# Patient Record
Sex: Male | Born: 1935
Health system: Southern US, Community
[De-identification: ages and names within clinical notes are randomized; demographics above are authoritative.]

## PROBLEM LIST (undated history)

## (undated) DIAGNOSIS — G479 Sleep disorder, unspecified: Secondary | ICD-10-CM

## (undated) DIAGNOSIS — E785 Hyperlipidemia, unspecified: Secondary | ICD-10-CM

## (undated) DIAGNOSIS — K5909 Other constipation: Secondary | ICD-10-CM

## (undated) DIAGNOSIS — I10 Essential (primary) hypertension: Secondary | ICD-10-CM

## (undated) DIAGNOSIS — N200 Calculus of kidney: Secondary | ICD-10-CM

## (undated) HISTORY — DX: Other constipation: K59.09

## (undated) HISTORY — PX: KIDNEY STONE SURGERY: SHX686

## (undated) HISTORY — DX: Calculus of kidney: N20.0

## (undated) HISTORY — DX: Hyperlipidemia, unspecified: E78.5

## (undated) HISTORY — DX: Sleep disorder, unspecified: G47.9

## (undated) HISTORY — PX: HEMORRHOID SURGERY: SHX153

## (undated) HISTORY — PX: CATARACT EXTRACTION W/ INTRAOCULAR LENS IMPLANT: SHX1309

---

## 2010-06-02 DIAGNOSIS — D239 Other benign neoplasm of skin, unspecified: Secondary | ICD-10-CM

## 2010-06-02 HISTORY — DX: Other benign neoplasm of skin, unspecified: D23.9

## 2013-06-20 ENCOUNTER — Institutional Professional Consult (permissible substitution): Payer: Self-pay | Admitting: Pulmonary Disease

## 2014-04-28 DIAGNOSIS — G4700 Insomnia, unspecified: Secondary | ICD-10-CM | POA: Insufficient documentation

## 2014-06-22 DIAGNOSIS — I10 Essential (primary) hypertension: Secondary | ICD-10-CM | POA: Insufficient documentation

## 2016-09-29 ENCOUNTER — Emergency Department
Admission: EM | Admit: 2016-09-29 | Discharge: 2016-09-29 | Disposition: A | Discharge status: Home / Self Care | Payer: Medicare Other | Attending: Emergency Medicine | Admitting: Emergency Medicine

## 2016-09-29 ENCOUNTER — Emergency Department: Payer: Medicare Other

## 2016-09-29 ENCOUNTER — Encounter: Payer: Self-pay | Admitting: Emergency Medicine

## 2016-09-29 DIAGNOSIS — K5900 Constipation, unspecified: Secondary | ICD-10-CM | POA: Diagnosis present

## 2016-09-29 DIAGNOSIS — I10 Essential (primary) hypertension: Secondary | ICD-10-CM | POA: Diagnosis not present

## 2016-09-29 DIAGNOSIS — K5901 Slow transit constipation: Secondary | ICD-10-CM

## 2016-09-29 HISTORY — DX: Essential (primary) hypertension: I10

## 2016-09-29 MED ORDER — MINERAL OIL RE ENEM
1.0000 | ENEMA | Freq: Once | RECTAL | Status: AC
  Administered 2016-09-29: 1 via RECTAL

## 2016-09-29 MED ORDER — SENNOSIDES-DOCUSATE SODIUM 8.6-50 MG PO TABS: 2.0000 | ORAL_TABLET | Freq: Every day | ORAL | 1 refills | Status: DC | PRN

## 2016-09-29 MED ORDER — GLYCERIN (ADULT) 2 G RE SUPP: 1.0000 | RECTAL | 0 refills | Status: DC | PRN

## 2016-09-29 MED ORDER — GLYCERIN (LAXATIVE) 2.1 G RE SUPP
RECTAL | Status: AC
  Filled 2016-09-29: qty 1

## 2016-09-29 MED ORDER — SENNOSIDES-DOCUSATE SODIUM 8.6-50 MG PO TABS: 2.0000 | ORAL_TABLET | Freq: Every day | ORAL | 1 refills | Status: AC

## 2016-09-29 NOTE — ED Notes (Signed)
Pt discharged home after verbalizing understanding of discharge instructions; nad noted. 

## 2016-09-29 NOTE — ED Triage Notes (Signed)
Patient arrives to ED via POV with c/o constipation x 3 days. Patient states he is normally able to have a BM every day. Patient A&O x4, ambulatory to room.

## 2016-09-29 NOTE — ED Provider Notes (Addendum)
[facility] Emergency Department Provider Note   ____________________________________________   First MD Initiated Contact with Patient 09/29/16 1426     (approximate)  I have reviewed the triage vital signs and the nursing notes.   HISTORY  Chief Complaint Constipation    HPI Tyler Tapia is a 82 y.o. male patient complain of constipation for 3 days. Patient stated normally has bowel movement every day. Patient denies any abdominal pain or bloating. Patient state tried laxative without relief. No other palliative measures for this complaint.  Past Medical History:  Diagnosis Date  . Hypertension     There are no active problems to display for this patient.   History reviewed. No pertinent surgical history.  Prior to Admission medications   Medication Sig Start Date End Date Taking? Authorizing Provider  glycerin adult 2 g suppository Place 1 suppository rectally as needed for constipation. 09/29/16   Sable Feil, PA-C  senna-docusate (SENOKOT-S) 8.6-50 MG tablet Take 2 tablets by mouth at bedtime. 09/29/16 09/29/17  Sable Feil, PA-C    Allergies Patient has no known allergies.  No family history on file.  Social History Social History  Substance Use Topics  . Smoking status: Never Smoker  . Smokeless tobacco: Never Used  . Alcohol use Yes     Comment: 1 glass of beer/wine a day    Review of Systems Constitutional: No fever/chills Eyes: No visual changes. ENT: No sore throat. Cardiovascular: Denies chest pain. Respiratory: Denies shortness of breath. Gastrointestinal: No abdominal pain.  No nausea, no vomiting.  No diarrhea.  3 days of constipation Genitourinary: Negative for dysuria. Musculoskeletal: Negative for back pain. Skin: Negative for rash. Neurological: Negative for headaches, focal weakness or numbness. Endocrine:Hypertension   ____________________________________________   PHYSICAL EXAM:  VITAL  SIGNS: ED Triage Vitals [09/29/16 1411]  Enc Vitals Group     BP (!) 144/94     Pulse Rate 82     Resp 17     Temp 97.7 F (36.5 C)     Temp Source Oral     SpO2 95 %     Weight 152 lb (68.9 kg)     Height 5\' 9"  (1.753 m)     Head Circumference      Peak Flow      Pain Score      Pain Loc      Pain Edu?      Excl. in [place]?     Constitutional: Alert and oriented. Well appearing and in no acute distress. Eyes: Conjunctivae are normal. PERRL. EOMI. Head: Atraumatic. Nose: No congestion/rhinnorhea. Mouth/Throat: Mucous membranes are moist.  Oropharynx non-erythematous. Neck: No stridor.  No cervical spine tenderness to palpation. Hematological/Lymphatic/Immunilogical: No cervical lymphadenopathy. Cardiovascular: Normal rate, regular rhythm. Grossly normal heart sounds.  Good peripheral circulation. Respiratory: Normal respiratory effort.  No retractions. Lungs CTAB. Gastrointestinal: Soft and nontender. No distention. No abdominal bruits. No CVA tenderness. Musculoskeletal: No lower extremity tenderness nor edema.  No joint effusions. Neurologic:  Normal speech and language. No gross focal neurologic deficits are appreciated. No gait instability. Skin:  Skin is warm, dry and intact. No rash noted. Psychiatric: Mood and affect are normal. Speech and behavior are normal.  ____________________________________________   LABS (all labs ordered are listed, but only abnormal results are displayed)  Labs Reviewed - No data to display ____________________________________________  EKG   ____________________________________________  RADIOLOGY  KUB reveals no impaction or obstruction. Moderate stool  ____________________________________________   PROCEDURES  Procedure(s)  performed: None  Procedures  Critical Care performed: No  ____________________________________________   INITIAL IMPRESSION / ASSESSMENT AND PLAN / ED COURSE  Pertinent labs & imaging results that  were available during my care of the patient were reviewed by me and considered in my medical decision making (see chart for details).  Constipation. Patient given discharge care instructions. Patient given a prescription for Peri-Colace. Patient advised return back in 24 hours if no bowel movement.  Clinical Course   Discussed with patient KUB findings. Patient requesting a Fleet enema. No return status post 15 minutes retention of Fleet enema. Patient placed on a mild laxative and stool softener.   ____________________________________________   FINAL CLINICAL IMPRESSION(S) / ED DIAGNOSES  Final diagnoses:  Constipation by delayed colonic transit      NEW MEDICATIONS STARTED DURING THIS VISIT:  New Prescriptions   GLYCERIN ADULT 2 G SUPPOSITORY    Place 1 suppository rectally as needed for constipation.   SENNA-DOCUSATE (SENOKOT-S) 8.6-50 MG TABLET    Take 2 tablets by mouth at bedtime.     Note:  This document was prepared using Dragon voice recognition software and may include unintentional dictation errors.    Sable Feil, PA-C 09/29/16 1605    Lisa Roca, MD 09/29/16 [place], PA-C 09/29/16 [place], MD 09/30/16 [phone]

## 2016-09-30 ENCOUNTER — Emergency Department
Admission: EM | Admit: 2016-09-30 | Discharge: 2016-09-30 | Disposition: A | Discharge status: Home / Self Care | Payer: Medicare Other | Attending: Emergency Medicine | Admitting: Emergency Medicine

## 2016-09-30 ENCOUNTER — Encounter: Payer: Self-pay | Admitting: Emergency Medicine

## 2016-09-30 DIAGNOSIS — Z79899 Other long term (current) drug therapy: Secondary | ICD-10-CM | POA: Diagnosis not present

## 2016-09-30 DIAGNOSIS — K5900 Constipation, unspecified: Secondary | ICD-10-CM | POA: Diagnosis present

## 2016-09-30 DIAGNOSIS — I10 Essential (primary) hypertension: Secondary | ICD-10-CM | POA: Insufficient documentation

## 2016-09-30 MED ORDER — MAGNESIUM CITRATE PO SOLN
1.0000 | Freq: Once | ORAL | Status: AC
  Administered 2016-09-30: 1 via ORAL
  Filled 2016-09-30: qty 296

## 2016-09-30 MED ORDER — POLYETHYLENE GLYCOL 3350 17 GM/SCOOP PO POWD: 17.0000 g | Freq: Two times a day (BID) | ORAL | 0 refills | Status: AC

## 2016-09-30 NOTE — ED Notes (Signed)
Per pt no BM yet, has been up to the bathroom to urinate x 1

## 2016-09-30 NOTE — ED Provider Notes (Signed)
[facility] Emergency Department Provider Note  ____________________________________________  Time seen: Approximately 11:29 AM  I have reviewed the triage vital signs and the nursing notes.   HISTORY  Chief Complaint Constipation    HPI Tyler Tapia is a 82 y.o. male who complains of constipation for 3 or 4 days. He was seen in the ED yesterday with complaints of constipation without any other complaints and no pain complaints. He received an x-ray which is consistent with constipation and an enema and rectal exam whichwere fruitless at the time. No evidence of fecal impaction on the x-ray.  Patient went home and took Senokot as instructed. He did have a small bowel movement this morning but is dissatisfied that he wasn't able to empty more. He comes back to the ED for repeat evaluation wondering if he has a fecal impaction.     Past Medical History:  Diagnosis Date  . Hypertension      There are no active problems to display for this patient.    History reviewed. No pertinent surgical history.   Prior to Admission medications   Medication Sig Start Date End Date Taking? Authorizing Provider  glycerin adult 2 g suppository Place 1 suppository rectally as needed for constipation. 09/29/16   Sable Feil, PA-C  polyethylene glycol powder (GLYCOLAX/MIRALAX) powder Take 17 g by mouth 2 (two) times daily. 09/30/16 10/03/16  Carrie Mew, MD  senna-docusate (SENOKOT-S) 8.6-50 MG tablet Take 2 tablets by mouth at bedtime. 09/29/16 09/29/17  Sable Feil, PA-C  senna-docusate (SENOKOT-S) 8.6-50 MG tablet Take 2 tablets by mouth daily as needed for mild constipation. 09/29/16 09/29/17  Sable Feil, PA-C     Allergies Patient has no known allergies.   No family history on file.  Social History Social History  Substance Use Topics  . Smoking status: Never Smoker  . Smokeless tobacco: Never Used  . Alcohol use Yes     Comment: 1 glass  of beer/wine a day    Review of Systems  Constitutional:   No fever or chills.  ENT:   No sore throat. No rhinorrhea. Cardiovascular:   No chest pain. Respiratory:   No dyspnea or cough. Gastrointestinal:   Negative for abdominal pain, vomiting and diarrhea.  Genitourinary:   Negative for dysuria or difficulty urinating. Musculoskeletal:   Negative for focal pain or swelling Neurological:   Negative for headaches 10-point ROS otherwise negative.  ____________________________________________   PHYSICAL EXAM:  VITAL SIGNS: ED Triage Vitals  Enc Vitals Group     BP 09/30/16 0921 (!) 148/89     Pulse Rate 09/30/16 0921 (!) 3     Resp 09/30/16 0921 18     Temp 09/30/16 0921 97.5 F (36.4 C)     Temp Source 09/30/16 0921 Oral     SpO2 09/30/16 0921 95 %     Weight 09/30/16 0922 152 lb (68.9 kg)     Height 09/30/16 0922 5\' 9"  (1.753 m)     Head Circumference --      Peak Flow --      Pain Score --      Pain Loc --      Pain Edu? --      Excl. in [place]? --     Vital signs reviewed, nursing assessments reviewed.   Constitutional:   Alert and oriented. Well appearing and in no distress. Eyes:   No scleral icterus. No conjunctival pallor. PERRL. EOMI.  No nystagmus. ENT   Head:  Normocephalic and atraumatic.   Nose:   No congestion/rhinnorhea. No septal hematoma   Mouth/Throat:   MMM, no pharyngeal erythema. No peritonsillar mass.    Neck:   No stridor. No SubQ emphysema. No meningismus. Hematological/Lymphatic/Immunilogical:   No cervical lymphadenopathy. Cardiovascular:   RRR. Symmetric bilateral radial and DP pulses.  No murmurs.  Respiratory:   Normal respiratory effort without tachypnea nor retractions. Breath sounds are clear and equal bilaterally. No wheezes/rales/rhonchi. Gastrointestinal:   Soft and nontender. Non distended. There is no CVA tenderness.  No rebound, rigidity, or guarding.Rectal exam shows external hemorrhoid that is not thrombosed inflamed  or bleeding. Rectal vault is empty. Genitourinary:   deferred Musculoskeletal:   Nontender with normal range of motion in all extremities. No joint effusions.  No lower extremity tenderness.  No edema. Neurologic:   Normal speech and language.  CN 2-10 normal. Motor grossly intact. No gross focal neurologic deficits are appreciated.  Skin:    Skin is warm, dry and intact. No rash noted.  No petechiae, purpura, or bullae.  ____________________________________________    LABS (pertinent positives/negatives) (all labs ordered are listed, but only abnormal results are displayed) Labs Reviewed - No data to display ____________________________________________   EKG    ____________________________________________    RADIOLOGY    ____________________________________________   PROCEDURES Procedures  ____________________________________________   INITIAL IMPRESSION / ASSESSMENT AND PLAN / ED COURSE  Pertinent labs & imaging results that were available during my care of the patient were reviewed by me and considered in my medical decision making (see chart for details).  Patient well appearing no acute distress, presents for reassessment of constipation complaint without pain. Abdominal exam is benign and exam is overall reassuring. Vital signs unremarkable. Heart rate is 70 on exam although erroneously documented in the flow sheet. Magnesium citrate given in the ED. I'll prescribe MiraLAX for 3 days as well. No fecal impaction. Discharge home.Considering the patient's symptoms, medical history, and physical examination today, I have low suspicion for cholecystitis or biliary pathology, pancreatitis, perforation or bowel obstruction, hernia, intra-abdominal abscess, AAA or dissection, volvulus or intussusception, mesenteric ischemia, or appendicitis.       Clinical Course    ____________________________________________   FINAL CLINICAL IMPRESSION(S) / ED  DIAGNOSES  Final diagnoses:  Constipation, unspecified constipation type      New Prescriptions   POLYETHYLENE GLYCOL POWDER (GLYCOLAX/MIRALAX) POWDER    Take 17 g by mouth 2 (two) times daily.     Portions of this note were generated with dragon dictation software. Dictation errors may occur despite best attempts at proofreading.    Carrie Mew, MD 09/30/16 [phone]

## 2016-09-30 NOTE — ED Triage Notes (Signed)
States was here yesterday for constipation. States he was supposed to recheck with Randel Pigg to update his progress. States did have BM this am.

## 2017-01-29 DIAGNOSIS — R739 Hyperglycemia, unspecified: Secondary | ICD-10-CM | POA: Insufficient documentation

## 2017-07-30 ENCOUNTER — Encounter: Payer: Self-pay | Admitting: *Deleted

## 2017-07-30 ENCOUNTER — Emergency Department: Payer: Medicare Other

## 2017-07-30 ENCOUNTER — Emergency Department
Admission: EM | Admit: 2017-07-30 | Discharge: 2017-07-30 | Disposition: A | Discharge status: Home / Self Care | Payer: Medicare Other | Attending: Emergency Medicine | Admitting: Emergency Medicine

## 2017-07-30 DIAGNOSIS — I10 Essential (primary) hypertension: Secondary | ICD-10-CM | POA: Diagnosis not present

## 2017-07-30 DIAGNOSIS — Z79899 Other long term (current) drug therapy: Secondary | ICD-10-CM | POA: Diagnosis not present

## 2017-07-30 DIAGNOSIS — M545 Low back pain: Secondary | ICD-10-CM | POA: Diagnosis present

## 2017-07-30 DIAGNOSIS — R1084 Generalized abdominal pain: Secondary | ICD-10-CM | POA: Insufficient documentation

## 2017-07-30 DIAGNOSIS — N23 Unspecified renal colic: Secondary | ICD-10-CM | POA: Diagnosis not present

## 2017-07-30 LAB — COMPREHENSIVE METABOLIC PANEL
ALK PHOS: 75 U/L (ref 38–126)
ALT: 14 U/L — AB (ref 17–63)
AST: 21 U/L (ref 15–41)
Albumin: 3.5 g/dL (ref 3.5–5.0)
Anion gap: 5 (ref 5–15)
BUN: 20 mg/dL (ref 6–20)
CALCIUM: 8.8 mg/dL — AB (ref 8.9–10.3)
CO2: 29 mmol/L (ref 22–32)
CREATININE: 1.16 mg/dL (ref 0.61–1.24)
Chloride: 104 mmol/L (ref 101–111)
GFR, EST NON AFRICAN AMERICAN: 52 mL/min — AB (ref >60)
Glucose, Bld: 110 mg/dL — ABNORMAL HIGH (ref 65–99)
Potassium: 3.9 mmol/L (ref 3.5–5.1)
Sodium: 138 mmol/L (ref 135–145)
Total Bilirubin: 0.6 mg/dL (ref 0.3–1.2)
Total Protein: 7.1 g/dL (ref 6.5–8.1)

## 2017-07-30 LAB — URINALYSIS, COMPLETE (UACMP) WITH MICROSCOPIC
BACTERIA UA: NONE SEEN
Bilirubin Urine: NEGATIVE
Glucose, UA: NEGATIVE mg/dL
HGB URINE DIPSTICK: NEGATIVE
Ketones, ur: NEGATIVE mg/dL
Leukocytes, UA: NEGATIVE
NITRITE: NEGATIVE
Protein, ur: NEGATIVE mg/dL
SPECIFIC GRAVITY, URINE: 1.017 (ref 1.005–1.030)
pH: 5 (ref 5.0–8.0)

## 2017-07-30 LAB — CBC WITH DIFFERENTIAL/PLATELET
Basophils Absolute: 0.1 10*3/uL (ref 0–0.1)
Basophils Relative: 1 %
Eosinophils Absolute: 0.1 10*3/uL (ref 0–0.7)
Eosinophils Relative: 2 %
HEMATOCRIT: 38.9 % — AB (ref 40.0–52.0)
HEMOGLOBIN: 13.3 g/dL (ref 13.0–18.0)
LYMPHS ABS: 1.6 10*3/uL (ref 1.0–3.6)
LYMPHS PCT: 21 %
MCH: 31.3 pg (ref 26.0–34.0)
MCHC: 34.2 g/dL (ref 32.0–36.0)
MCV: 91.8 fL (ref 80.0–100.0)
Monocytes Absolute: 0.8 10*3/uL (ref 0.2–1.0)
Monocytes Relative: 10 %
NEUTROS PCT: 66 %
Neutro Abs: 5.2 10*3/uL (ref 1.4–6.5)
Platelets: 346 10*3/uL (ref 150–440)
RBC: 4.24 MIL/uL — AB (ref 4.40–5.90)
RDW: 13 % (ref 11.5–14.5)
WBC: 7.8 10*3/uL (ref 3.8–10.6)

## 2017-07-30 MED ORDER — ACETAMINOPHEN 325 MG PO TABS: 650.0000 mg | ORAL_TABLET | Freq: Four times a day (QID) | ORAL | 0 refills | Status: DC | PRN

## 2017-07-30 MED ORDER — IBUPROFEN 600 MG PO TABS: 600.0000 mg | ORAL_TABLET | Freq: Three times a day (TID) | ORAL | 0 refills | Status: DC | PRN

## 2017-07-30 MED ORDER — ACETAMINOPHEN 325 MG PO TABS
650.0000 mg | ORAL_TABLET | Freq: Once | ORAL | Status: AC
Start: 2017-07-30 — End: 2017-07-30
  Administered 2017-07-30: 650 mg via ORAL
  Filled 2017-07-30: qty 2

## 2017-07-30 NOTE — ED Notes (Signed)
Spoke with Nevin Bloodgood from [facility] who said she would call around to find a ride for patient and then call us back with an ETA. Patient and primary RN made aware.

## 2017-07-30 NOTE — ED Notes (Signed)
AAOx3.  Skin warm and dry.  [facility] called with patient's discharge and is sending transportation to pick patient up.  Patient discharged to ED Lobby and instructed that ride will be here to pick him up.  Understanding verbalized.

## 2017-07-30 NOTE — Discharge Instructions (Signed)
Please drink plenty of fluid to stay well hydrated and help your kidney stone pass. You may take Tylenol or Motrin for any pain.  Please make an appointment with your primary care physician, as well as the urologist for follow-up.  Return to the emergency department if you develop severe pain, fever, pain or burning when you pee, lightheadedness or fainting, nausea or vomiting, or any other symptoms concerning to you.

## 2017-07-30 NOTE — ED Notes (Signed)
Pt called out this tech and secretary checked on pt, pt given water and hooked back up to bp cuff

## 2017-07-30 NOTE — ED Provider Notes (Signed)
[facility] Emergency Department Provider Note    First MD Initiated Contact with Patient 07/30/17 [phone]     (approximate)  I have reviewed the triage vital signs and the nursing notes.   HISTORY  Chief Complaint Back Pain   HPI Tehran Rabenold is a 83 y.o. male with history of Hypertension presents from independent living at [facility] via EMS with increasing low back pain. Patient states approximately 3 days ago he noted soreness to his lower back that was nonradiating which was worse with activity. Patient describes the pain as sharp and shooting however states that it does not radiate to the buttocks or lower legs. Patient states on awakening this morning pain reoccurred and was more severe than it had been in the past. Patient states his pain score was 8 out of 10 but now currently pain-free. Patient denies any lower 70 weakness numbness or gait instability. Patient denies any abdominal discomfort. Patient does admit to urinary discomfort and frequency.   Past Medical History:  Diagnosis Date  . Hypertension     There are no active problems to display for this patient.   Past surgical history None  Prior to Admission medications   Medication Sig Start Date End Date Taking? Authorizing Provider  glycerin adult 2 g suppository Place 1 suppository rectally as needed for constipation. 09/29/16   Sable Feil, PA-C  senna-docusate (SENOKOT-S) 8.6-50 MG tablet Take 2 tablets by mouth at bedtime. 09/29/16 09/29/17  Sable Feil, PA-C  senna-docusate (SENOKOT-S) 8.6-50 MG tablet Take 2 tablets by mouth daily as needed for mild constipation. 09/29/16 09/29/17  Sable Feil, PA-C    Allergies no known drug allergies  History reviewed. No pertinent family history.  Social History Social History  Substance Use Topics  . Smoking status: Never Smoker  . Smokeless tobacco: Never Used  . Alcohol use Yes     Comment: 1 glass of beer/wine a day     Review of Systems Constitutional: No fever/chills Eyes: No visual changes. ENT: No sore throat. Cardiovascular: Denies chest pain. Respiratory: Denies shortness of breath. Gastrointestinal: No abdominal pain.  No nausea, no vomiting.  No diarrhea.  No constipation. Genitourinary: Positive for dysuria. Musculoskeletal: Negative for neck pain.  positive for back pain. Integumentary: Negative for rash. Neurological: Negative for headaches, focal weakness or numbness.   ____________________________________________   PHYSICAL EXAM:  VITAL SIGNS: ED Triage Vitals  Enc Vitals Group     BP 07/30/17 0559 (!) 152/91     Pulse Rate 07/30/17 0559 (!) 47     Resp 07/30/17 0559 16     Temp 07/30/17 0559 97.7 F (36.5 C)     Temp Source 07/30/17 0559 Oral     SpO2 07/30/17 0559 100 %     Weight 07/30/17 0600 68 kg (150 lb)     Height 07/30/17 0600 1.753 m (5\' 9" )     Head Circumference --      Peak Flow --      Pain Score 07/30/17 0558 5     Pain Loc --      Pain Edu? --      Excl. in [place]? --     Constitutional: Alert and oriented. Well appearing and in no acute distress. Eyes: Conjunctivae are normal. Head: Atraumatic. Mouth/Throat: Mucous membranes are moist.  Oropharynx non-erythematous. Neck: No stridor.  Cardiovascular: Normal rate, regular rhythm. Good peripheral circulation. Grossly normal heart sounds. Respiratory: Normal respiratory effort.  No retractions. Lungs  CTAB. Gastrointestinal: Soft and nontender. No distention.  Musculoskeletal: No lower extremity tenderness nor edema. No gross deformities of extremities. Neurologic:  Normal speech and language. No gross focal neurologic deficits are appreciated.  Skin:  Skin is warm, dry and intact. No rash noted. Psychiatric: Mood and affect are normal. Speech and behavior are normal.  ____________________________________________   LABS (all labs ordered are listed, but only abnormal results are displayed)  Labs  Reviewed  CBC WITH DIFFERENTIAL/PLATELET  COMPREHENSIVE METABOLIC PANEL  URINALYSIS, COMPLETE (UACMP) WITH MICROSCOPIC   __________________________  Procedures   ____________________________________________   INITIAL IMPRESSION / ASSESSMENT AND PLAN / ED COURSE  As part of my medical decision making, I reviewed the following data within the electronic MEDICAL RECORD [id number] year old male presenting with above stated history and physical exam of back pain. Concern for possible ureterolithiasis and a such CT scan renal study ordered With results pending at this time. Patient's care transferred to Dr. Jimmye Norman    ____________________________________________  FINAL CLINICAL IMPRESSION(S) / ED DIAGNOSES  Final diagnoses:  Renal colic     MEDICATIONS GIVEN DURING THIS VISIT:  Medications - No data to display   NEW OUTPATIENT MEDICATIONS STARTED DURING THIS VISIT:  New Prescriptions   No medications on file    Modified Medications   No medications on file    Discontinued Medications   No medications on file     Note:  This document was prepared using Dragon voice recognition software and may include unintentional dictation errors.    Gregor Hams, MD 08/01/17 [phone]

## 2017-07-30 NOTE — ED Triage Notes (Signed)
Pt lives independtly at [facility]. Came in by EMS for increasingly serve back pain 5/10 pain. A&Ox4. States back has been sore x3-4 days but this morning when he got up it was very sharp and shooting. Non radiating

## 2017-07-30 NOTE — ED Provider Notes (Signed)
This patient was signed out to me by Dr. Marjean Donna. 83 year old gentleman with low back pain and some urinary symptoms. The patient had a reassuring urinalysis without UTI or blood, but his CT scan did show a small renal calculus in the bladder and a calcification on the posterior aspect of the bladder or immediately adjacent to the right ureterovesical junction. I have reexamined the patient, who does not have any midline lumbar spine tenderness to palpation and no reproducible back pain on my exam.  At this time, the patient states that he is feeling significantly better. He has a normal white blood cell count, he is afebrile, and his creatinine is within normal limits. I will provide the patient with a strainer and urologic follow-up information. He will also follow up with his primary care physician. We talked about symptomatic expectations. The patient was also concerned about decreased volume of stools, but states that he is having regular stools, takes Metamucil regularly, and has been eating less. I have asked him to discuss this with his primary care physician as well. At this time, the patient is safe for discharge. Return precautions as well as follow-up instructions were discussed directly with the patient and written on his discharge paperwork.   Eula Listen, MD 07/30/17 [phone]

## 2017-07-30 NOTE — ED Notes (Signed)
Patient ambulatory to lobby with steady gait. Patient verbalized understanding of discharge instructions and follow-up care. Hard copy of discharge signed by patient and placed in chart.

## 2017-08-06 NOTE — Progress Notes (Signed)
08/07/2017 11:19 AM   Tyler Tapia 06/15/1921 [id number]  Referring provider: Derinda Late, MD [id number] [address] [place], [place] [address]  Chief Complaint  Patient presents with  . New Patient (Initial Visit)  . Nephrolithiasis    HPI: Patient is a 83 year old Caucasian male who is referred by [facility] ED for nephrolithiasis.  Patient was seen at [facility] emergency department on 07/30/2017 for a history of 3 days of soreness to his lower back that was nonradiating, but became worse with activity. He described the pain as sharp and shooting.  It became an 8 out of 10 pain and therefore he was brought to the emergency department for further evaluation.  He was also having urinary discomfort and frequency.  In the ED, he received Tylenol.   His UA was negative. His serum creatinine was 1.16.  Previous creatinine was 1.12 weeks ago. His WBC count was 7.8.    CT renal stone study noted no renal or ureteral obstructing stone or hydronephrosis. 3 x 4 x 5 mm calcification posterior aspect of the bladder immediately adjacent to right ureteral vesicle junction may represent a recently passed stone. There is a small stone within a left lateral bladder diverticulum. Partially decompressed urinary bladder with circumferential wall thickening greatest posteriorly and along the dome. Asymmetric enlargement of prostate gland greater on the left with impression upon the bladder base.  5.5 cm left lower pole renal cyst. Taking into account limitation by non contrast imaging, no worrisome renal or adrenal lesion.    Today, he is experiencing frequency x 4-5, urgency and nocturia x 4-5.   UA today is negative.  His PVR is 40 mL.  He has not had gross hematuria, suprapubic pain or dysuria. He has not had fevers, chills, nausea or vomiting.    He does not have a prior history of stones.       PMH: Past Medical History:  Diagnosis Date  .  Hyperlipidemia   . Hypertension   . Kidney stone     Surgical History: No past surgical history on file.  Home Medications:  Allergies as of 08/07/2017   No Known Allergies     Medication List       Accurate as of 08/07/17 11:19 AM. Always use your most recent med list.          acetaminophen 325 MG tablet Commonly known as:  TYLENOL Take 2 tablets (650 mg total) by mouth every 6 (six) hours as needed for mild pain or moderate pain.   glycerin adult 2 g suppository Place 1 suppository rectally as needed for constipation.   hydrochlorothiazide 25 MG tablet Commonly known as:  HYDRODIURIL Take 12.5 mg by mouth daily.   ibuprofen 600 MG tablet Commonly known as:  ADVIL,MOTRIN Take 1 tablet (600 mg total) by mouth every 8 (eight) hours as needed for mild pain.   MULTI-VITAMINS Tabs Take 1 tablet by mouth daily.   PSYLLIUM PO Take by mouth daily.   senna-docusate 8.6-50 MG tablet Commonly known as:  Senokot-S Take 2 tablets by mouth at bedtime.       Allergies: No Known Allergies  Family History: Family History  Problem Relation Age of Onset  . Kidney cancer Neg Hx   . Kidney disease Neg Hx   . Prostate cancer Neg Hx     Social History:  reports that he has never smoked. He has never used smokeless tobacco. He reports  that he drinks alcohol. His drug history is not on file.  ROS: UROLOGY Frequent Urination?: Yes Hard to postpone urination?: Yes Burning/pain with urination?: No Get up at night to urinate?: Yes Leakage of urine?: No Urine stream starts and stops?: No Trouble starting stream?: No Do you have to strain to urinate?: No Blood in urine?: No Urinary tract infection?: No Sexually transmitted disease?: No Injury to kidneys or bladder?: No Painful intercourse?: No Weak stream?: No Erection problems?: Yes Penile pain?: No  Gastrointestinal Nausea?: No Vomiting?: No Indigestion/heartburn?: No Diarrhea?: No Constipation?:  No  Constitutional Fever: No Night sweats?: No Weight loss?: No Fatigue?: No  Skin Skin rash/lesions?: No Itching?: No  Eyes Blurred vision?: No Double vision?: No  Ears/Nose/Throat Sore throat?: No Sinus problems?: No  Hematologic/Lymphatic Swollen glands?: No Easy bruising?: No  Cardiovascular Leg swelling?: No Chest pain?: No  Respiratory Cough?: No Shortness of breath?: No  Endocrine Excessive thirst?: No  Musculoskeletal Back pain?: No Joint pain?: No  Neurological Headaches?: No Dizziness?: No  Psychologic Depression?: No Anxiety?: No  Physical Exam: BP (!) 163/101   Pulse 91   Ht 5\' 10"  (1.778 m)   Wt 151 lb (68.5 kg)   BMI 21.67 kg/m   Constitutional: Well nourished. Alert and oriented, No acute distress. HEENT: [place] AT, moist mucus membranes. Trachea midline, no masses. Cardiovascular: No clubbing, cyanosis, or edema. Respiratory: Normal respiratory effort, no increased work of breathing. GI: Abdomen is soft, non tender, non distended, no abdominal masses. Liver and spleen not palpable.  No hernias appreciated.  Stool sample for occult testing is not indicated.   GU: No CVA tenderness.  No bladder fullness or masses.  Patient with circumcised phallus.  Urethral meatus is patent.  No penile discharge. No penile lesions or rashes. Scrotum without lesions, cysts, rashes and/or edema.  Testicles are located scrotally bilaterally. No masses are appreciated in the testicles. Left and right epididymis are normal. Rectal: Patient with  normal sphincter tone. Anus and perineum without scarring or rashes. No rectal masses are appreciated. Prostate is approximately 70 + grams, irregular.  Seminal vesicles are normal. Skin: No rashes, bruises or suspicious lesions. Lymph: No cervical or inguinal adenopathy. Neurologic: Grossly intact, no focal deficits, moving all 4 extremities. Psychiatric: Normal mood and affect.  Laboratory Data: Lab Results   Component Value Date   WBC 7.8 07/30/2017   HGB 13.3 07/30/2017   HCT 38.9 (L) 07/30/2017   MCV 91.8 07/30/2017   PLT 346 07/30/2017    Lab Results  Component Value Date   CREATININE 1.16 07/30/2017    Lab Results  Component Value Date   AST 21 07/30/2017   Lab Results  Component Value Date   ALT 14 (L) 07/30/2017    Urinalysis Negative.  See EPIC.    I have reviewed the labs.   Pertinent Imaging: CLINICAL DATA:  83 year old male with increasing back pain. Non radiating. Hypertension. Initial encounter.  EXAM: CT ABDOMEN AND PELVIS WITHOUT CONTRAST  TECHNIQUE: Multidetector CT imaging of the abdomen and pelvis was performed following the standard protocol without IV contrast.  COMPARISON:  09/29/2016 plain film exam.  No comparison CT.  FINDINGS: Lower chest: Mild basilar atelectasis versus scar. Question tiny amount of fluid within right fissure. Heart size top-normal. Aortic valve calcifications. Right coronary artery calcifications. Small to moderate-size hiatal hernia.  Hepatobiliary: Elongated liver spanning over 19 cm. Probable cyst anterior left lobe liver. Taking into account limitation by non contrast imaging, no worrisome hepatic lesion.  No calcified gallstones.  Pancreas: Taking into account limitation by non contrast imaging, no pancreatic mass or inflammation.  Spleen: Taking into account limitation by non contrast imaging, no splenic mass or enlargement.  Adrenals/Urinary Tract: No renal or ureteral obstructing stone or hydronephrosis. 3 x 4 x 5 mm calcification posterior aspect of the bladder immediately adjacent to right ureteral vesicle junction may represent a recently passed stone. There is a small stone within a left lateral bladder diverticulum. Partially decompressed urinary bladder with circumferential wall thickening greatest posteriorly and along the dome. Asymmetric enlargement of prostate gland greater on the left  with impression upon the bladder base.  5.5 cm left lower pole renal cyst. Taking into account limitation by non contrast imaging, no worrisome renal or adrenal lesion.  Stomach/Bowel: Prominent sigmoid diverticulosis without evidence of diverticulitis. This includes muscular hypertrophy.  Overall, no extraluminal bowel inflammatory process, free fluid or free air is noted.  Small to moderate-size hiatal hernia. Tiny duodenal diverticulum incidentally noted.  Vascular/Lymphatic: Atherosclerotic changes aorta with ectasia with maximal transverse dimension 2.5 cm. Ectatic iliac arteries.  Scattered normal size lymph nodes without adenopathy.  Reproductive: Enlarged prostate gland greater on the left impressing upon the bladder base.  Other: Small right hydrocele. Fact containing inguinal hernias greater on the right. No bowel containing hernia.  Musculoskeletal: Scoliosis lumbar spine convex right with superimposed degenerative changes. Mild loss height L4 superior endplate. No acute compression fracture.  IMPRESSION: No renal or ureteral obstructing stone or hydronephrosis. 3 x 4 x 5 mm calcification posterior aspect of the bladder immediately adjacent to right ureteral vesicle junction may represent a recently passed stone.  Small stone within a left lateral bladder diverticulum. Partially decompressed urinary bladder with wall thickening greatest posteriorly and along the dome. Asymmetric enlargement of prostate gland greater on the left with impression upon the bladder base.  Sigmoid diverticulosis without findings of diverticulitis.  Small to moderate-size hiatal hernia.  Aortic Atherosclerosis (ICD10-I70.0).  Coronary artery calcifications.  Scoliosis lumbar spine convex right with superimposed degenerative changes. Mild loss height L4 superior endplate. No acute compression fracture.  Elongated liver spanning over 19 cm.   Electronically  Signed   By: Genia Del M.D.   On: 07/30/2017 07:31 I have independently reviewed the films.   Results for Tyler Tapia, Tyler Tapia (MRN [id number]) as of 08/07/2017 11:19  Ref. Range 08/07/2017 11:10  Scan Result Unknown 40    Assessment & Plan:    1. Right ureteral stone  - stone sent for analysis  - I will call with patient with results  2. Bladder diverticulum  - explained to the patient what a diverticulum is and the cause of the condition  - no further intervention warranted at this time   3. BPH with LUTS  - Continue conservative management, avoiding bladder irritants and timed voiding's  - most bothersome symptoms is/are frequency and nocturia  - RTC prn  4. Asymmetrical prostate  - Explained to the patient that this may be a benign condition, but it may also be due to prostate cancer and due to his age we do not recommend PSA screening and that any treatment for prostate cancer would be palliative at this time - he is in total agreement and does not want to pursue any treatment for his prostate either for BPH and/or cancer at this time  5. Bladder wall thickening  - Explained to the patient this finding and it is most likely due to his bladder muscles being over worse trying  to push past the resistance of a large prostate - could not be sure that any cancer was located in the bladder the patient has not experienced any gross hematuria and due to his age he does not want to pursue any further evaluation or treatment at this time - he will contact us if he experiences gross hematuria  Return if symptoms worsen or fail to improve.  These notes generated with voice recognition software. I apologize for typographical errors.  Zara Council, [facility] [address], [place], [place] [address] [phone]

## 2017-08-07 ENCOUNTER — Encounter: Payer: Self-pay | Admitting: Urology

## 2017-08-07 ENCOUNTER — Ambulatory Visit (INDEPENDENT_AMBULATORY_CARE_PROVIDER_SITE_OTHER): Payer: Medicare Other | Admitting: Urology

## 2017-08-07 VITALS — BP 163/101 | HR 91 | Ht 70.0 in | Wt 151.0 lb

## 2017-08-07 DIAGNOSIS — N323 Diverticulum of bladder: Secondary | ICD-10-CM

## 2017-08-07 DIAGNOSIS — N4289 Other specified disorders of prostate: Secondary | ICD-10-CM

## 2017-08-07 DIAGNOSIS — N3289 Other specified disorders of bladder: Secondary | ICD-10-CM

## 2017-08-07 DIAGNOSIS — N201 Calculus of ureter: Secondary | ICD-10-CM

## 2017-08-07 DIAGNOSIS — N138 Other obstructive and reflux uropathy: Secondary | ICD-10-CM

## 2017-08-07 DIAGNOSIS — N401 Enlarged prostate with lower urinary tract symptoms: Secondary | ICD-10-CM | POA: Diagnosis not present

## 2017-08-07 LAB — MICROSCOPIC EXAMINATION
Bacteria, UA: NONE SEEN
Epithelial Cells (non renal): NONE SEEN /hpf (ref 0–10)
RBC, UA: NONE SEEN /hpf (ref 0–?)
WBC UA: NONE SEEN /HPF (ref 0–?)

## 2017-08-07 LAB — URINALYSIS, COMPLETE
Bilirubin, UA: NEGATIVE
GLUCOSE, UA: NEGATIVE
Ketones, UA: NEGATIVE
LEUKOCYTES UA: NEGATIVE
Nitrite, UA: NEGATIVE
PROTEIN UA: NEGATIVE
Specific Gravity, UA: 1.01 (ref 1.005–1.030)
Urobilinogen, Ur: 0.2 mg/dL (ref 0.2–1.0)
pH, UA: 7.5 (ref 5.0–7.5)

## 2017-08-07 LAB — BLADDER SCAN AMB NON-IMAGING: Scan Result: 40

## 2017-08-10 LAB — CULTURE, URINE COMPREHENSIVE

## 2017-08-15 ENCOUNTER — Other Ambulatory Visit: Payer: Self-pay | Admitting: Urology

## 2017-09-02 ENCOUNTER — Telehealth: Payer: Self-pay | Admitting: Urology

## 2017-09-02 NOTE — Telephone Encounter (Signed)
Please let Tyler Tapia know that his stone was composed of calcium oxalate monohydrate, calcium oxalate dihydrate and calcium phosphate.  It is the most common type of kidney stones that people form.  In general, a person needs to drink 2.5 L of water dally, avoid sodas, reduce salt and protein in his diet.  Please mail him a copy of the report.

## 2017-09-03 NOTE — Telephone Encounter (Signed)
LMOM- will mail results and recommendations.

## 2017-09-04 ENCOUNTER — Emergency Department
Admission: EM | Admit: 2017-09-04 | Discharge: 2017-09-04 | Disposition: A | Discharge status: Home / Self Care | Payer: Medicare Other | Attending: Emergency Medicine | Admitting: Emergency Medicine

## 2017-09-04 ENCOUNTER — Other Ambulatory Visit: Payer: Self-pay

## 2017-09-04 ENCOUNTER — Emergency Department: Payer: Medicare Other

## 2017-09-04 DIAGNOSIS — E785 Hyperlipidemia, unspecified: Secondary | ICD-10-CM | POA: Diagnosis not present

## 2017-09-04 DIAGNOSIS — N644 Mastodynia: Secondary | ICD-10-CM

## 2017-09-04 DIAGNOSIS — I10 Essential (primary) hypertension: Secondary | ICD-10-CM | POA: Diagnosis not present

## 2017-09-04 DIAGNOSIS — N6459 Other signs and symptoms in breast: Secondary | ICD-10-CM | POA: Insufficient documentation

## 2017-09-04 DIAGNOSIS — Z79899 Other long term (current) drug therapy: Secondary | ICD-10-CM | POA: Insufficient documentation

## 2017-09-04 MED ORDER — CEPHALEXIN 250 MG PO CAPS: 250.0000 mg | ORAL_CAPSULE | Freq: Three times a day (TID) | ORAL | 0 refills | Status: AC

## 2017-09-04 MED ORDER — CEPHALEXIN 500 MG PO CAPS: 500.0000 mg | ORAL_CAPSULE | Freq: Two times a day (BID) | ORAL | 0 refills | Status: DC

## 2017-09-04 NOTE — ED Triage Notes (Addendum)
Pt c/o right nipple tenderness with palpation for the past 5 days, denies any mass or knot at the site, denies injury. No redness or swelling noted.

## 2017-09-04 NOTE — ED Provider Notes (Signed)
[facility] Emergency Department Provider Note  ____________________________________________   I have reviewed the triage vital signs and the nursing notes.   HISTORY  Chief Complaint tenderness to the nipple area    HPI Tyler Tapia is a 83 y.o. male with very few medical problems relative to his age, presents today with pain to his right nipple.  Only hurts when he touches it.  Is been like that for about a 5 days.  No trauma.  No fever no chills no swelling.  Does not hurt when he is not touching it.  It does not have any other symptoms no exertional symptoms, no chest pain or shortness of breath no nausea no vomiting, there is no redness, there is no other complaints.  He is worried that maybe he is getting cancer and that is why he came in.    Past Medical History:  Diagnosis Date  . Hyperlipidemia   . Hypertension   . Kidney stone     There are no active problems to display for this patient.   History reviewed. No pertinent surgical history.  Prior to Admission medications   Medication Sig Start Date End Date Taking? Authorizing Provider  hydrochlorothiazide (HYDRODIURIL) 25 MG tablet Take 12.5 mg by mouth daily. 05/01/17  Yes [provider]  Multiple Vitamin (MULTI-VITAMINS) TABS Take 1 tablet by mouth daily.   Yes [provider]  NON FORMULARY Take 1 Dose by mouth at bedtime.   Yes [provider]  PSYLLIUM PO Take by mouth daily.   Yes [provider]  acetaminophen (TYLENOL) 325 MG tablet Take 2 tablets (650 mg total) by mouth every 6 (six) hours as needed for mild pain or moderate pain. 07/30/17 07/30/18  Eula Listen, MD  glycerin adult 2 g suppository Place 1 suppository rectally as needed for constipation. Patient not taking: Reported on 09/04/2017 09/29/16   Sable Feil, PA-C  ibuprofen (ADVIL,MOTRIN) 600 MG tablet Take 1 tablet (600 mg total) by mouth every 8 (eight) hours as needed  for mild pain. Patient not taking: Reported on 08/07/2017 07/30/17   Eula Listen, MD  senna-docusate (SENOKOT-S) 8.6-50 MG tablet Take 2 tablets by mouth at bedtime. Patient not taking: Reported on 09/04/2017 09/29/16 09/29/17  Sable Feil, PA-C  zolpidem (AMBIEN) 5 MG tablet Take 5 mg by mouth at bedtime as needed.  08/17/17   [provider]    Allergies Patient has no known allergies.  Family History  Problem Relation Age of Onset  . Kidney cancer Neg Hx   . Kidney disease Neg Hx   . Prostate cancer Neg Hx     Social History Social History   Tobacco Use  . Smoking status: Never Smoker  . Smokeless tobacco: Never Used  Substance Use Topics  . Alcohol use: Yes    Comment: 1 glass of beer/wine a day  . Drug use: Not on file    Review of Systems Constitutional: No fever/chills Eyes: No visual changes. ENT: No sore throat. No stiff neck no neck pain Cardiovascular: Denies chest pain. Respiratory: Denies shortness of breath. Gastrointestinal:   no vomiting.  No diarrhea.  No constipation. Genitourinary: Negative for dysuria. Musculoskeletal: Negative lower extremity swelling Skin: Negative for rash. Neurological: Negative for severe headaches, focal weakness or numbness.   ____________________________________________   PHYSICAL EXAM:  VITAL SIGNS: ED Triage Vitals  Enc Vitals Group     BP 09/04/17 0918 (!) 156/94     Pulse Rate 09/04/17  0918 72     Resp 09/04/17 0918 18     Temp 09/04/17 0918 (!) 97.5 F (36.4 C)     Temp Source 09/04/17 0918 Oral     SpO2 09/04/17 0918 98 %     Weight 09/04/17 0920 150 lb (68 kg)     Height 09/04/17 0920 5\' 9"  (1.753 m)     Head Circumference --      Peak Flow --      Pain Score 09/04/17 0924 3     Pain Loc --      Pain Edu? --      Excl. in [place]? --     Constitutional: Alert and oriented. Well appearing and in no acute distress. Eyes: Conjunctivae are normal Head: Atraumatic HEENT: No  congestion/rhinnorhea. Mucous membranes are moist.  Oropharynx non-erythematous Neck:   Nontender with no meningismus, no masses, no stridor Cardiovascular: Normal rate, regular rhythm. Grossly normal heart sounds.  Good peripheral circulation. Chest: Normal to palpation in all regards except for over the right nipple itself which is tender to touch.  Is not red or swollen however there is no abscess palpated, there is no exudate, there is no tenderness to any palpation anywhere else.  There is no crepitus there is no flail chest but I touched his nipple is, he reacts and states that it is tender.  The nipple perhaps is slightly more red on the right than the left.  Otherwise no difference is noted.  There is certainly no evidence of cellulitis. Respiratory: Normal respiratory effort.  No retractions. Lungs CTAB. Abdominal: Soft and nontender. No distention. No guarding no rebound Back:  There is no focal tenderness or step off.  there is no midline tenderness there are no lesions noted. there is no CVA tenderness Musculoskeletal: No lower extremity tenderness, no upper extremity tenderness. No joint effusions, no DVT signs strong distal pulses no edema Neurologic:  Normal speech and language. No gross focal neurologic deficits are appreciated.  Skin:  Skin is warm, dry and intact. No rash noted. Psychiatric: Mood and affect are normal. Speech and behavior are normal.  ____________________________________________   LABS (all labs ordered are listed, but only abnormal results are displayed)  Labs Reviewed - No data to display  Pertinent labs  results that were available during my care of the patient were reviewed by me and considered in my medical decision making (see chart for details). ____________________________________________  EKG  I personally interpreted any EKGs ordered by me or triage Sinus rhythm rate 62 bpm no acute ST elevation or depression, borderline  LAD ____________________________________________  RADIOLOGY  Pertinent labs & imaging results that were available during my care of the patient were reviewed by me and considered in my medical decision making (see chart for details). If possible, patient and/or family made aware of any abnormal findings.  Dg Chest 2 View  Result Date: 09/04/2017 CLINICAL DATA:  Right nipple tenderness for 1 week. No known injury. EXAM: CHEST  2 VIEW COMPARISON:  None. FINDINGS: Minimal atelectasis or scar is seen in the lingula. Lungs are otherwise clear. Heart size is normal. No pneumothorax or pleural fluid. No acute bony abnormality. IMPRESSION: No acute disease. Electronically Signed   By: Inge Rise M.D.   On: 09/04/2017 10:07   ____________________________________________    PROCEDURES  Procedure(s) performed: None  Procedures  Critical Care performed: None  ____________________________________________   INITIAL IMPRESSION / ASSESSMENT AND PLAN / ED COURSE  Pertinent labs & imaging results  that were available during my care of the patient were reviewed by me and considered in my medical decision making (see chart for details).  Patient here with very reproducible tenderness over the nipple only.  X-ray is negative bedside ultrasound does not show an abscess.  The nipple looks slightly redder on the right than the left.  Certainly could be an early infection.  Other processes that are considered but not felt to be likely include oncologic processes, patient does not have any evidence of ACS PE or dissection, this is very reproducible focal tenderness.  I will start him on Keflex for a few days to see if this helps his symptoms if there may be a small infection involved I do not think blood work is indicated.  Patient will follow closely with PCP and extensive return precautions and follow-up given and understood.    ____________________________________________   FINAL CLINICAL  IMPRESSION(S) / ED DIAGNOSES  Final diagnoses:  None      This chart was dictated using voice recognition software.  Despite best efforts to proofread,  errors can occur which can change meaning.      Schuyler Amor, MD 09/04/17 [phone]

## 2017-09-04 NOTE — Discharge Instructions (Signed)
If you have shortness of breath, increased pain, swelling, redness, fever, chills, or other concerns return to the emergency room.  Will follow closely with primary care doctor in the next few days for recheck

## 2017-09-04 NOTE — ED Notes (Signed)
Patient is complaining of right nipple tenderness x 1 week.  Patient denies any other complaints.  Area is painful to touch when MD palpated area.  Patient is in no obvious distress at this time.

## 2017-09-04 NOTE — ED Notes (Signed)
First Nurse: pt presents to desk with c/o of right side chest pain only upon palpation.  Denies pain when not touching the area. NAD. Pt was sent over by PCP.

## 2018-01-07 DIAGNOSIS — D039 Melanoma in situ, unspecified: Secondary | ICD-10-CM

## 2018-01-07 HISTORY — DX: Melanoma in situ, unspecified: D03.9

## 2018-01-13 ENCOUNTER — Encounter: Payer: Self-pay | Admitting: *Deleted

## 2018-01-13 ENCOUNTER — Emergency Department: Payer: Medicare Other

## 2018-01-13 ENCOUNTER — Emergency Department
Admission: EM | Admit: 2018-01-13 | Discharge: 2018-01-13 | Disposition: A | Discharge status: Home / Self Care | Payer: Medicare Other | Attending: Emergency Medicine | Admitting: Emergency Medicine

## 2018-01-13 ENCOUNTER — Other Ambulatory Visit: Payer: Self-pay

## 2018-01-13 DIAGNOSIS — R195 Other fecal abnormalities: Secondary | ICD-10-CM | POA: Diagnosis present

## 2018-01-13 DIAGNOSIS — K5900 Constipation, unspecified: Secondary | ICD-10-CM | POA: Diagnosis not present

## 2018-01-13 DIAGNOSIS — I10 Essential (primary) hypertension: Secondary | ICD-10-CM | POA: Insufficient documentation

## 2018-01-13 DIAGNOSIS — Z79899 Other long term (current) drug therapy: Secondary | ICD-10-CM | POA: Insufficient documentation

## 2018-01-13 NOTE — ED Provider Notes (Addendum)
[facility] Emergency Department Provider Note  ____________________________________________   I have reviewed the triage vital signs and the nursing notes. Where available I have reviewed prior notes and, if possible and indicated, outside hospital notes.    HISTORY  Chief Complaint Constipation    HPI Tyler Tapia is a 84 y.o. male who is remarkably healthy, he states that he has been having decreased stool for the last couple days.  Is not completely calcified he is passing stool but not very much.  To compensate, he took extra MiraLAX and then he came into the emergency department for further investigation of his mild constipation and had a loose stool and now feels better and has no complaints.  He has no chest pain shortness breath nausea vomiting he has no fever or chills he denies any abdominal pain.  He does have a history of "sometimes I get gas pains" but is not having that now and he was not he states having it at any time in the recent past.    Past Medical History:  Diagnosis Date  . Hyperlipidemia   . Hypertension   . Kidney stone     There are no active problems to display for this patient.   Past Surgical History:  Procedure Laterality Date  . HEMORRHOID SURGERY      Prior to Admission medications   Medication Sig Start Date End Date Taking? Authorizing Provider  acetaminophen (TYLENOL) 325 MG tablet Take 2 tablets (650 mg total) by mouth every 6 (six) hours as needed for mild pain or moderate pain. 07/30/17 07/30/18  Eula Listen, MD  glycerin adult 2 g suppository Place 1 suppository rectally as needed for constipation. Patient not taking: Reported on 09/04/2017 09/29/16   Sable Feil, PA-C  hydrochlorothiazide (HYDRODIURIL) 25 MG tablet Take 12.5 mg by mouth daily. 05/01/17   [provider]  ibuprofen (ADVIL,MOTRIN) 600 MG tablet Take 1 tablet (600 mg total) by mouth every 8 (eight) hours as needed for mild  pain. Patient not taking: Reported on 08/07/2017 07/30/17   Eula Listen, MD  Multiple Vitamin (MULTI-VITAMINS) TABS Take 1 tablet by mouth daily.    [provider]  NON FORMULARY Take 1 Dose by mouth at bedtime.    [provider]  PSYLLIUM PO Take by mouth daily.    [provider]  zolpidem (AMBIEN) 5 MG tablet Take 5 mg by mouth at bedtime as needed.  08/17/17   [provider]    Allergies Patient has no known allergies.  Family History  Problem Relation Age of Onset  . Kidney cancer Neg Hx   . Kidney disease Neg Hx   . Prostate cancer Neg Hx     Social History Social History   Tobacco Use  . Smoking status: Never Smoker  . Smokeless tobacco: Never Used  Substance Use Topics  . Alcohol use: Yes    Comment: 1 glass of beer/wine a day  . Drug use: Not on file    Review of Systems Constitutional: No fever/chills Eyes: No visual changes. ENT: No sore throat. No stiff neck no neck pain Cardiovascular: Denies chest pain. Respiratory: Denies shortness of breath. Gastrointestinal: See HPI. Genitourinary: Negative for dysuria. Musculoskeletal: Negative lower extremity swelling Skin: Negative for rash. Neurological: Negative for severe headaches, focal weakness or numbness.   ____________________________________________   PHYSICAL EXAM:  VITAL SIGNS: ED Triage Vitals  Enc Vitals Group     BP 01/13/18 1242 (!) 158/89  Pulse Rate 01/13/18 1242 77     Resp 01/13/18 1242 20     Temp 01/13/18 1242 97.9 F (36.6 C)     Temp Source 01/13/18 1242 Oral     SpO2 01/13/18 1242 96 %     Weight 01/13/18 1243 150 lb (68 kg)     Height 01/13/18 1243 5\' 9"  (1.753 m)     Head Circumference --      Peak Flow --      Pain Score 01/13/18 1301 0     Pain Loc --      Pain Edu? --      Excl. in [place]? --     Constitutional: Alert and oriented. Well appearing and in no acute distress. Eyes: Conjunctivae are normal Head:  Atraumatic HEENT: No congestion/rhinnorhea. Mucous membranes are moist.  Oropharynx non-erythematous Neck:   Nontender with no meningismus, no masses, no stridor Cardiovascular: Normal rate, regular rhythm. Grossly normal heart sounds.  Good peripheral circulation. Respiratory: Normal respiratory effort.  No retractions. Lungs CTAB. Abdominal: Soft and nontender. No distention. No guarding no rebound Back:  There is no focal tenderness or step off.  there is no midline tenderness there are no lesions noted. there is no CVA tenderness Musculoskeletal: No lower extremity tenderness, no upper extremity tenderness. No joint effusions, no DVT signs strong distal pulses no edema Neurologic:  Normal speech and language. No gross focal neurologic deficits are appreciated.  Skin:  Skin is warm, dry and intact. No rash noted. Psychiatric: Mood and affect are normal. Speech and behavior are normal.  ____________________________________________   LABS (all labs ordered are listed, but only abnormal results are displayed)  Labs Reviewed - No data to display  Pertinent labs  results that were available during my care of the patient were reviewed by me and considered in my medical decision making (see chart for details). ____________________________________________  EKG  I personally interpreted any EKGs ordered by me or triage  ____________________________________________  RADIOLOGY  Pertinent labs & imaging results that were available during my care of the patient were reviewed by me and considered in my medical decision making (see chart for details). If possible, patient and/or family made aware of any abnormal findings.  Dg Abdomen Acute W/chest  Result Date: 01/13/2018 CLINICAL DATA:  Constipation over the last several days. EXAM: DG ABDOMEN ACUTE W/ 1V CHEST COMPARISON:  09/04/2017 FINDINGS: Heart size is normal. There is aortic atherosclerosis. Chronic pulmonary scarring at the lung bases  left more than right. No free air seen under the diaphragm. Two view abdominal radiographs show abnormal small bowel pattern. There are small air-fluid levels in the colon, consistent with liquid stool. Chronic curvature in degenerative changes of the spine. No acute bone finding. Likely benign bone island in the sacrum. IMPRESSION: Small air-fluid levels in nondilated colon, consistent with liquid stool. No pattern of obstruction identified. Mild basilar pulmonary scarring. Electronically Signed   By: Nelson Chimes M.D.   On: 01/13/2018 15:13   ____________________________________________    PROCEDURES  Procedure(s) performed: None  Procedures  Critical Care performed: None  ____________________________________________   INITIAL IMPRESSION / ASSESSMENT AND PLAN / ED COURSE  Pertinent labs & imaging results that were available during my care of the patient were reviewed by me and considered in my medical decision making (see chart for details).  Patient with no abdominal pain or tenderness presents after some unusual bowel movement patterns he does have a history of constipation he was more transmitted than  normal over the last couple days, took some extra MiraLAX and then had some loose stools.  He has had 2 loose stools total in the last 48 hours.  He does not have any melena or bright red blood per rectum, he denies any fever or chills.  He feels pretty good at this time.  He states he has been very worried about his bowels and he wanted to make sure that he did not have anything else going on with an x-ray.  X-ray was obtained I personally reviewed, it is reassuring, serial abdominal exams are benign at this time I do not think any further intervention is required in the emergency room  Considering the patient's symptoms, medical history, and physical examination today, I have low suspicion for cholecystitis or biliary pathology, pancreatitis, perforation or bowel obstruction, hernia,  intra-abdominal abscess, AAA or dissection, volvulus or intussusception, mesenteric ischemia, ischemic gut, pyelonephritis or appendicitis.    ____________________________________________   FINAL CLINICAL IMPRESSION(S) / ED DIAGNOSES  Final diagnoses:  None      This chart was dictated using voice recognition software.  Despite best efforts to proofread,  errors can occur which can change meaning.      Schuyler Amor, MD 01/13/18 1531    Schuyler Amor, MD 01/13/18 [phone]

## 2018-01-13 NOTE — ED Triage Notes (Signed)
Pt reports constipation x4 days.

## 2018-01-13 NOTE — ED Triage Notes (Addendum)
Patient c/o constipation for approximately 4 days, Patient states he used Miralax twice with good results, but hasn't worked recently. Patient states he has to use a lot of pressure to have a BM. Patient states he drinks water everyday.  Patient states he had a BM yesterday that was loose, but hasn't had a BM today. Patient states he has had gas pains that kept him awake, but that he also has a sleeping problem that he uses chamomile tea to relieve.

## 2018-01-13 NOTE — ED Notes (Signed)
Pt able to ambulate to the restroom without assistance.

## 2018-01-13 NOTE — Discharge Instructions (Addendum)
Drink plenty of fluids, if you have abdominal pain, fever or chills, or you feel worse in any way return to the emergency room.  Do not take MiraLAX for the next few days until your stool normalizes.  You may have diarrhea for a few days.  If you feel weak or deconditioned or otherwise unwell, if you have bleeding or any other concerns, please return to the emergency department.

## 2018-05-08 DIAGNOSIS — Z87442 Personal history of urinary calculi: Secondary | ICD-10-CM | POA: Insufficient documentation

## 2018-05-08 DIAGNOSIS — E785 Hyperlipidemia, unspecified: Secondary | ICD-10-CM | POA: Insufficient documentation

## 2018-05-13 ENCOUNTER — Emergency Department: Payer: Medicare Other

## 2018-05-13 ENCOUNTER — Encounter: Payer: Self-pay | Admitting: Emergency Medicine

## 2018-05-13 ENCOUNTER — Emergency Department
Admission: EM | Admit: 2018-05-13 | Discharge: 2018-05-13 | Disposition: A | Discharge status: Home / Self Care | Payer: Medicare Other | Attending: Emergency Medicine | Admitting: Emergency Medicine

## 2018-05-13 DIAGNOSIS — K5902 Outlet dysfunction constipation: Secondary | ICD-10-CM | POA: Insufficient documentation

## 2018-05-13 DIAGNOSIS — N400 Enlarged prostate without lower urinary tract symptoms: Secondary | ICD-10-CM | POA: Diagnosis not present

## 2018-05-13 DIAGNOSIS — Z79899 Other long term (current) drug therapy: Secondary | ICD-10-CM | POA: Insufficient documentation

## 2018-05-13 DIAGNOSIS — R1030 Lower abdominal pain, unspecified: Secondary | ICD-10-CM | POA: Diagnosis present

## 2018-05-13 DIAGNOSIS — Z87891 Personal history of nicotine dependence: Secondary | ICD-10-CM | POA: Insufficient documentation

## 2018-05-13 DIAGNOSIS — I10 Essential (primary) hypertension: Secondary | ICD-10-CM | POA: Insufficient documentation

## 2018-05-13 LAB — URINALYSIS, COMPLETE (UACMP) WITH MICROSCOPIC
Bacteria, UA: NONE SEEN
Bilirubin Urine: NEGATIVE
GLUCOSE, UA: NEGATIVE mg/dL
Hgb urine dipstick: NEGATIVE
Ketones, ur: NEGATIVE mg/dL
Leukocytes, UA: NEGATIVE
Nitrite: NEGATIVE
PH: 8 (ref 5.0–8.0)
Protein, ur: NEGATIVE mg/dL
SPECIFIC GRAVITY, URINE: 1.003 — AB (ref 1.005–1.030)
SQUAMOUS EPITHELIAL / LPF: NONE SEEN (ref 0–5)

## 2018-05-13 LAB — COMPREHENSIVE METABOLIC PANEL
ALT: 13 U/L (ref 0–44)
AST: 22 U/L (ref 15–41)
Albumin: 3.6 g/dL (ref 3.5–5.0)
Alkaline Phosphatase: 88 U/L (ref 38–126)
Anion gap: 9 (ref 5–15)
BUN: 17 mg/dL (ref 8–23)
CALCIUM: 8.9 mg/dL (ref 8.9–10.3)
CHLORIDE: 101 mmol/L (ref 98–111)
CO2: 30 mmol/L (ref 22–32)
Creatinine, Ser: 1.18 mg/dL (ref 0.61–1.24)
GFR calc Af Amer: 59 mL/min — ABNORMAL LOW (ref >60)
GFR, EST NON AFRICAN AMERICAN: 51 mL/min — AB (ref >60)
GLUCOSE: 105 mg/dL — AB (ref 70–99)
POTASSIUM: 3.5 mmol/L (ref 3.5–5.1)
SODIUM: 140 mmol/L (ref 135–145)
Total Bilirubin: 0.6 mg/dL (ref 0.3–1.2)
Total Protein: 7.4 g/dL (ref 6.5–8.1)

## 2018-05-13 LAB — CBC
HCT: 38.8 % — ABNORMAL LOW (ref 40.0–52.0)
Hemoglobin: 13.5 g/dL (ref 13.0–18.0)
MCH: 31.9 pg (ref 26.0–34.0)
MCHC: 34.7 g/dL (ref 32.0–36.0)
MCV: 91.9 fL (ref 80.0–100.0)
Platelets: 375 K/uL (ref 150–440)
RBC: 4.23 MIL/uL — ABNORMAL LOW (ref 4.40–5.90)
RDW: 12.9 % (ref 11.5–14.5)
WBC: 6.1 K/uL (ref 3.8–10.6)

## 2018-05-13 LAB — TROPONIN I: Troponin I: <0.03 ng/mL

## 2018-05-13 LAB — LIPASE, BLOOD: LIPASE: 47 U/L (ref 11–51)

## 2018-05-13 LAB — LACTIC ACID, PLASMA: Lactic Acid, Venous: 1.6 mmol/L (ref 0.5–1.9)

## 2018-05-13 MED ORDER — POLYETHYLENE GLYCOL 3350 17 GM/SCOOP PO POWD: ORAL | 0 refills | Status: DC

## 2018-05-13 NOTE — Discharge Instructions (Signed)
Your prostate is 5.5cm big, based on the CT scan from today. It is seen pressing upward on your bladder, as well as protruding into the rectum, making it difficult to pass your stool.  Take miralax two times a day for the next 3 days to thin your stool and make it easier to pass.  Repeat this when necessary. Follow up with Dr. Caryl Comes and with urology for further evaluation.

## 2018-05-13 NOTE — ED Triage Notes (Signed)
Pt reports lower abd pain since last night before he went to bed; pt says he hasn't slept well; tried to have a bowel movement but was unable to; drank a lot of water but that didn't help; denies urinary s/s; pt ambulatory with steady gait; declined wheelchair;

## 2018-05-13 NOTE — ED Notes (Signed)
Patient transported to CT 

## 2018-05-13 NOTE — ED Notes (Signed)
EDP at bedside doing rectal exam. RN at bedside.

## 2018-05-13 NOTE — ED Notes (Signed)
This RN contacted Security at [facility] for transport. Security stated will return call in a few for arrival time. RN will monitor.

## 2018-05-13 NOTE — ED Provider Notes (Signed)
[facility] Emergency Department Provider Note  ____________________________________________  Time seen: Approximately 8:01 AM  I have reviewed the triage vital signs and the nursing notes.   HISTORY  Chief Complaint Abdominal Pain    HPI Tyler Tapia is a 84 y.o. male with a history of hyperlipidemia, hypertension, kidney stones who complains of diffuse lower abdominal pain that started last night, gradual onset, waxing and waning, nonradiating, no aggravating or alleviating factors.  He actually reports that on my interview his pain has resolved and he feels back to normal.  He does note that he has had chronic constipation issues.  He has to strain to pass his stool and it is often small caliber.  He has been taking Colace for the past 4 days without relief.  No vomiting.  No difficulty urinating.      Past Medical History:  Diagnosis Date  . Hyperlipidemia   . Hypertension   . Kidney stone      There are no active problems to display for this patient.    Past Surgical History:  Procedure Laterality Date  . HEMORRHOID SURGERY    . KIDNEY STONE SURGERY       Prior to Admission medications   Medication Sig Start Date End Date Taking? Authorizing Provider  acetaminophen (TYLENOL) 325 MG tablet Take 2 tablets (650 mg total) by mouth every 6 (six) hours as needed for mild pain or moderate pain. 07/30/17 07/30/18  Eula Listen, MD  glycerin adult 2 g suppository Place 1 suppository rectally as needed for constipation. Patient not taking: Reported on 09/04/2017 09/29/16   Sable Feil, PA-C  hydrochlorothiazide (HYDRODIURIL) 25 MG tablet Take 12.5 mg by mouth daily. 05/01/17   [provider]  ibuprofen (ADVIL,MOTRIN) 600 MG tablet Take 1 tablet (600 mg total) by mouth every 8 (eight) hours as needed for mild pain. Patient not taking: Reported on 08/07/2017 07/30/17   Eula Listen, MD  Multiple Vitamin (MULTI-VITAMINS)  TABS Take 1 tablet by mouth daily.    [provider]  NON FORMULARY Take 1 Dose by mouth at bedtime.    [provider]  polyethylene glycol powder (GLYCOLAX/MIRALAX) powder 1 cap full in a full glass of water, two times a day for 3 days. 05/13/18   Carrie Mew, MD  PSYLLIUM PO Take by mouth daily.    [provider]  zolpidem (AMBIEN) 5 MG tablet Take 5 mg by mouth at bedtime as needed.  08/17/17   [provider]     Allergies Patient has no known allergies.   Family History  Problem Relation Age of Onset  . Kidney cancer Neg Hx   . Kidney disease Neg Hx   . Prostate cancer Neg Hx     Social History Social History   Tobacco Use  . Smoking status: Former Research scientist (life sciences)  . Smokeless tobacco: Never Used  Substance Use Topics  . Alcohol use: Yes    Alcohol/week: 3.6 oz    Types: 6 Glasses of wine per week  . Drug use: Not on file    Review of Systems  Constitutional:   No fever or chills.  Cardiovascular:   No chest pain or syncope. Respiratory:   No dyspnea or cough. Gastrointestinal:   Negative for abdominal pain, vomiting and diarrhea.  Positive constipation Musculoskeletal:   Negative for focal pain or swelling All other systems reviewed and are negative except as documented above in ROS and HPI.  ____________________________________________   PHYSICAL EXAM:  VITAL SIGNS: ED Triage Vitals [05/13/18 0541]  Enc Vitals Group     BP (!) 191/88     Pulse Rate 70     Resp 18     Temp 97.7 F (36.5 C)     Temp Source Oral     SpO2 95 %     Weight 150 lb (68 kg)     Height 5\' 9"  (1.753 m)     Head Circumference      Peak Flow      Pain Score 7     Pain Loc      Pain Edu?      Excl. in [place]?     Vital signs reviewed, nursing assessments reviewed.   Constitutional:   Alert and oriented. Non-toxic appearance. Eyes:   Conjunctivae are normal. EOMI.  ENT      Head:   Normocephalic and atraumatic.      Nose:   No  congestion/rhinnorhea.       Mouth/Throat:   MMM, no pharyngeal erythema. No peritonsillar mass.       Neck:   No meningismus. Full ROM. Hematological/Lymphatic/Immunilogical:   No cervical lymphadenopathy. Cardiovascular:   RRR. Symmetric bilateral radial and DP pulses.  No murmurs. Cap refill less than 2 seconds. Respiratory:   Normal respiratory effort without tachypnea/retractions. Breath sounds are clear and equal bilaterally. No wheezes/rales/rhonchi. Gastrointestinal:   Soft and nontender. Non distended. There is no CVA tenderness.  No rebound, rigidity, or guarding.  Rectal exam shows a small external hemorrhoid that is not thrombosed inflamed or bleeding.  The prostate is firm and markedly enlarged, nontender.  It protrudes into the rectum, leaving little space for the passage of stool. Genitourinary:   Normal Musculoskeletal:   Normal range of motion in all extremities. No joint effusions.  No lower extremity tenderness.  No edema. Neurologic:   Normal speech and language.  Motor grossly intact. No acute focal neurologic deficits are appreciated.  Skin:    Skin is warm, dry and intact. No rash noted.  No petechiae, purpura, or bullae.  ____________________________________________    LABS (pertinent positives/negatives) (all labs ordered are listed, but only abnormal results are displayed) Labs Reviewed  COMPREHENSIVE METABOLIC PANEL - Abnormal; Notable for the following components:      Result Value   Glucose, Bld 105 (*)    GFR calc non Af Amer 51 (*)    GFR calc Af Amer 59 (*)    All other components within normal limits  CBC - Abnormal; Notable for the following components:   RBC 4.23 (*)    HCT 38.8 (*)    All other components within normal limits  URINALYSIS, COMPLETE (UACMP) WITH MICROSCOPIC - Abnormal; Notable for the following components:   Color, Urine STRAW (*)    APPearance CLEAR (*)    Specific Gravity, Urine 1.003 (*)    All other components within normal  limits  LIPASE, BLOOD  LACTIC ACID, PLASMA  TROPONIN I  LACTIC ACID, PLASMA   ____________________________________________   EKG    ____________________________________________    RADIOLOGY  Ct Renal Stone Study  Result Date: 05/13/2018 CLINICAL DATA:  Acute onset of lower abdominal pain. EXAM: CT ABDOMEN AND PELVIS WITHOUT CONTRAST TECHNIQUE: Multidetector CT imaging of the abdomen and pelvis was performed following the standard protocol without IV contrast. COMPARISON:  CT of the abdomen and pelvis from 07/30/2017 FINDINGS: Lower chest: The visualized lung bases are grossly clear. Scattered coronary artery calcifications are seen. A small  to moderate hiatal hernia is noted. Hepatobiliary: A 9 mm nonspecific hypodensity is noted at the right hepatic lobe, relatively stable from 2018 and likely benign. The gallbladder is unremarkable in appearance. The common bile duct remains normal in caliber. Pancreas: The pancreas is within normal limits. Spleen: The spleen is unremarkable in appearance. Adrenals/Urinary Tract: The adrenal glands are unremarkable in appearance. Nonspecific perinephric stranding is noted bilaterally. A large left renal cyst is seen. There is no evidence of hydronephrosis. No renal or ureteral stones are identified. Stomach/Bowel: The stomach is unremarkable in appearance. The small bowel is within normal limits. The appendix is not visualized; there is no evidence for appendicitis. Scattered diverticulosis is noted along the sigmoid colon, without evidence of diverticulitis. Vascular/Lymphatic: Scattered calcification is seen along the abdominal aorta and its branches. The abdominal aorta is otherwise grossly unremarkable. The inferior vena cava is grossly unremarkable. No retroperitoneal lymphadenopathy is seen. No pelvic sidewall lymphadenopathy is identified. Reproductive: Several diverticula are noted arising about the bladder. There is impression on the base of the bladder  by the enlarged prostate, which measures 5.6 cm in transverse dimension. There is an incompletely descended testis on the left, possibly reflecting transient retraction. Other: No additional soft tissue abnormalities are seen. Musculoskeletal: No acute osseous abnormalities are identified. Multilevel vacuum phenomenon is noted along the lumbar spine. The visualized musculature is unremarkable in appearance. IMPRESSION: 1. No acute abnormality seen to explain the patient's symptoms. 2. Scattered diverticulosis along the sigmoid colon, without evidence of diverticulitis. 3. Scattered coronary artery calcifications seen. 4. Incompletely descended testis on the left may reflect transient retraction. 5. Small to moderate hiatal hernia noted. 6. Several diverticula arising about the bladder. There is impression on the base of the bladder by the enlarged prostate, which measures 5.6 cm in transverse dimension. Would correlate with PSA, as deemed clinically appropriate. Aortic Atherosclerosis (ICD10-I70.0). Electronically Signed   By: Garald Balding M.D.   On: 05/13/2018 07:00    ____________________________________________   PROCEDURES Procedures  ____________________________________________  DIFFERENTIAL DIAGNOSIS   Kidney stone, enlarged prostate, prostate cancer, prostatic abscess, idiopathic constipation  CLINICAL IMPRESSION / ASSESSMENT AND PLAN / ED COURSE  Pertinent labs & imaging results that were available during my care of the patient were reviewed by me and considered in my medical decision making (see chart for details).    Patient is not in distress, presents with overall unremarkable vital signs except for hypertension.  Predominant symptom is constipation.  Pain is resolved.  Labs are reassuring.  Physical exam reveals a markedly enlarged prostate.  CT scan obtained for further evaluation which does show a 5.5 cm prostate that is pressing upward on the bladder.  On physical exam is also  protruding into the rectum causing mechanical obstruction to stool passage and subsequent constipation symptoms.  After my initial assessment the patient was able to pass a moderate amount of small caliber stool.  Recommended continue Colace, take MiraLAX twice daily for 3 days to thin the stool further.  Follow-up with primary care and urology for further assessment.      ____________________________________________   FINAL CLINICAL IMPRESSION(S) / ED DIAGNOSES    Final diagnoses:  Constipation due to outlet dysfunction  Enlarged prostate on rectal examination  Lower abdominal pain     ED Discharge Orders        Ordered    polyethylene glycol powder (GLYCOLAX/MIRALAX) powder     05/13/18 0759      Portions of this note were generated with  [employer]. Dictation errors may occur despite best attempts at proofreading.    Carrie Mew, MD 05/13/18 [phone]

## 2018-05-29 NOTE — Progress Notes (Signed)
05/30/2018 11:04 AM   Tyler Tapia 02/08/1921 [id number]  Referring provider: Adin Hector, MD [facility] [place], [place] [address]  Chief Complaint  Patient presents with  . Nephrolithiasis    New Patient    HPI: Patient is a 84 year old Caucasian male with a history of nephrolithiasis, bladder diverticulum, BPH with LUTS, asymmetrical prostate and bladder wall thickening who presents today for follow-up.    History of nephrolithiasis CT renal stone study 07/30/2017 noted no renal or ureteral obstructing stone or hydronephrosis. 3 x 4 x 5 mm calcification posterior aspect of the bladder immediately adjacent to right ureteral vesicle junction may represent a recently passed stone. There is a small stone within a left lateral bladder diverticulum. Partially decompressed urinary bladder with circumferential wall thickening greatest posteriorly and along the dome. Asymmetric enlargement of prostate gland greater on the left with impression upon the bladder base.  5.5 cm left lower pole renal cyst. Taking into account limitation by non contrast imaging, no worrisome renal or adrenal lesion.     Stone analysis was positive for 73% calcium oxalate monohydrate, 24% calcium oxalate dihydrate and 3% calcium phosphate carbonate.  Bladder diverticulum CT Renal stone 05/13/2018 noted several diverticula arising about the bladder.   BPH WITH LUTS  (prostate and/or bladder) CT Renal stone 05/13/2018 noted there is impression on the base of the bladder by the enlarged prostate, which measures 5.6 cm in transverse dimension.  His PVR is 48 mL.    Major complaint(s):  Nocturia x number of  years. Denies any dysuria, hematuria or suprapubic pain.   Asymmetrical prostate Appreciated on exam  Bladder wall thickening Seen on CT.      PMH: Past Medical History:  Diagnosis Date  . Hyperlipidemia   . Hypertension   . Kidney stone     Surgical  History: Past Surgical History:  Procedure Laterality Date  . HEMORRHOID SURGERY    . KIDNEY STONE SURGERY      Home Medications:  Allergies as of 05/30/2018   No Known Allergies     Medication List        Accurate as of 05/30/18 11:04 AM. Always use your most recent med list.          hydrochlorothiazide 25 MG tablet Commonly known as:  HYDRODIURIL Take 12.5 mg by mouth daily.   MULTI-VITAMINS Tabs Take 1 tablet by mouth daily.   zolpidem 5 MG tablet Commonly known as:  AMBIEN Take 5 mg by mouth at bedtime as needed.       Allergies: No Known Allergies  Family History: Family History  Problem Relation Age of Onset  . Tuberculosis Mother   . Tuberculosis Father   . Kidney cancer Neg Hx   . Kidney disease Neg Hx   . Prostate cancer Neg Hx     Social History:  reports that he has quit smoking. He has never used smokeless tobacco. He reports that he drinks about 6.0 standard drinks of alcohol per week. His drug history is not on file.  ROS: UROLOGY Frequent Urination?: No Hard to postpone urination?: No Burning/pain with urination?: No Get up at night to urinate?: Yes Leakage of urine?: No Urine stream starts and stops?: No Trouble starting stream?: No Do you have to strain to urinate?: No Blood in urine?: No Urinary tract infection?: No Sexually transmitted disease?: No Injury to kidneys or bladder?: No Painful intercourse?: No Weak stream?: No Erection problems?: No Penile pain?:  No  Gastrointestinal Nausea?: No Vomiting?: No Indigestion/heartburn?: No Diarrhea?: No Constipation?: Yes  Constitutional Fever: No Night sweats?: No Weight loss?: No Fatigue?: No  Skin Skin rash/lesions?: No Itching?: No  Eyes Blurred vision?: No Double vision?: No  Ears/Nose/Throat Sore throat?: No Sinus problems?: No  Hematologic/Lymphatic Swollen glands?: No Easy bruising?: No  Cardiovascular Leg swelling?: No Chest pain?:  No  Respiratory Cough?: No Shortness of breath?: No  Endocrine Excessive thirst?: No  Musculoskeletal Back pain?: No Joint pain?: No  Neurological Headaches?: No Dizziness?: No  Psychologic Depression?: No Anxiety?: No  Physical Exam: BP 134/79   Pulse 73   Ht 5\' 10"  (1.778 m)   Wt 150 lb (68 kg)   BMI 21.52 kg/m   Constitutional: Well nourished. Alert and oriented, No acute distress. HEENT: [place] AT, moist mucus membranes. Trachea midline, no masses. Cardiovascular: No clubbing, cyanosis, or edema. Respiratory: Normal respiratory effort, no increased work of breathing. GI: Abdomen is soft, non tender, non distended, no abdominal masses. Liver and spleen not palpable.  No hernias appreciated.  Stool sample for occult testing is not indicated.   GU: No CVA tenderness.  No bladder fullness or masses.  Patient with circumcised phallus.  Urethral meatus is patent.  No penile discharge. No penile lesions or rashes. Scrotum without lesions, cysts, rashes and/or edema.  Testicles are located scrotally bilaterally. No masses are appreciated in the testicles. Left and right epididymis are normal. Rectal: Patient with  normal sphincter tone. Anus and perineum without scarring or rashes. No rectal masses are appreciated. Prostate is approximately 70 + grams, irregular, could only palpate the apex.   Skin: No rashes, bruises or suspicious lesions. Lymph: No cervical or inguinal adenopathy. Neurologic: Grossly intact, no focal deficits, moving all 4 extremities. Psychiatric: Normal mood and affect.  Laboratory Data: Lab Results  Component Value Date   WBC 6.1 05/13/2018   HGB 13.5 05/13/2018   HCT 38.8 (L) 05/13/2018   MCV 91.9 05/13/2018   PLT 375 05/13/2018    Lab Results  Component Value Date   CREATININE 1.18 05/13/2018    Lab Results  Component Value Date   AST 22 05/13/2018   Lab Results  Component Value Date   ALT 13 05/13/2018   I have reviewed the  labs.   Pertinent Imaging: CLINICAL DATA:  Acute onset of lower abdominal pain.  EXAM: CT ABDOMEN AND PELVIS WITHOUT CONTRAST  TECHNIQUE: Multidetector CT imaging of the abdomen and pelvis was performed following the standard protocol without IV contrast.  COMPARISON:  CT of the abdomen and pelvis from 07/30/2017  FINDINGS: Lower chest: The visualized lung bases are grossly clear. Scattered coronary artery calcifications are seen. A small to moderate hiatal hernia is noted.  Hepatobiliary: A 9 mm nonspecific hypodensity is noted at the right hepatic lobe, relatively stable from 2018 and likely benign. The gallbladder is unremarkable in appearance. The common bile duct remains normal in caliber.  Pancreas: The pancreas is within normal limits.  Spleen: The spleen is unremarkable in appearance.  Adrenals/Urinary Tract: The adrenal glands are unremarkable in appearance.  Nonspecific perinephric stranding is noted bilaterally. A large left renal cyst is seen. There is no evidence of hydronephrosis. No renal or ureteral stones are identified.  Stomach/Bowel: The stomach is unremarkable in appearance. The small bowel is within normal limits. The appendix is not visualized; there is no evidence for appendicitis.  Scattered diverticulosis is noted along the sigmoid colon, without evidence of diverticulitis.  Vascular/Lymphatic: Scattered calcification  is seen along the abdominal aorta and its branches. The abdominal aorta is otherwise grossly unremarkable. The inferior vena cava is grossly unremarkable. No retroperitoneal lymphadenopathy is seen. No pelvic sidewall lymphadenopathy is identified.  Reproductive: Several diverticula are noted arising about the bladder. There is impression on the base of the bladder by the enlarged prostate, which measures 5.6 cm in transverse dimension. There is an incompletely descended testis on the left, possibly reflecting  transient retraction.  Other: No additional soft tissue abnormalities are seen.  Musculoskeletal: No acute osseous abnormalities are identified. Multilevel vacuum phenomenon is noted along the lumbar spine. The visualized musculature is unremarkable in appearance.  IMPRESSION: 1. No acute abnormality seen to explain the patient's symptoms. 2. Scattered diverticulosis along the sigmoid colon, without evidence of diverticulitis. 3. Scattered coronary artery calcifications seen. 4. Incompletely descended testis on the left may reflect transient retraction. 5. Small to moderate hiatal hernia noted. 6. Several diverticula arising about the bladder. There is impression on the base of the bladder by the enlarged prostate, which measures 5.6 cm in transverse dimension. Would correlate with PSA, as deemed clinically appropriate.  Aortic Atherosclerosis (ICD10-I70.0).   Electronically Signed   By: Garald Balding M.D.   On: 05/13/2018 07:00  I have independently reviewed the films.      Assessment & Plan:    1.  History of nephrolithiasis No stone seen on recent CT  2. Bladder diverticulum Likely due to outlet obstruction by massive prostate, but PVR is minimal   3. BPH with LUTS Finasteride not helpful as patient's prostate is massive Patient states he is having adequate bowel movements at this time  4. Asymmetrical prostate Discussed with the patient his prostate enlargement and irregular exam.  We discussed that this may be the result of the prostate cancer and whether or not he is interested in pursuing diagnosis and/or treatment.  He states that this time he is more interested in quality of life and not quantity.  He does not want to undergo blood draws, imaging studies or biopsies to evaluate for prostate cancer.  He states if he was found to have prostate cancer he does not want to undergo ADT therapy or any other type of treatments.  He states that his most support  concerning life is the time he has left making the most of it and when the end time is near that he is very comfortable and he is allowed to go peacefully.  I advised him to make sure he has an ADR and to make his wishes known the family members and medical providers.    5. Bladder wall thickening See above   Return if symptoms worsen or fail to improve.  These notes generated with voice recognition software. I apologize for typographical errors.  Zara Council, PA-C  [facility] [address] [place], [place] [address] [phone]

## 2018-05-30 ENCOUNTER — Ambulatory Visit (INDEPENDENT_AMBULATORY_CARE_PROVIDER_SITE_OTHER): Payer: Medicare Other | Admitting: Urology

## 2018-05-30 ENCOUNTER — Encounter: Payer: Self-pay | Admitting: Urology

## 2018-05-30 VITALS — BP 134/79 | HR 73 | Ht 70.0 in | Wt 150.0 lb

## 2018-05-30 DIAGNOSIS — N401 Enlarged prostate with lower urinary tract symptoms: Secondary | ICD-10-CM

## 2018-05-30 DIAGNOSIS — Z87442 Personal history of urinary calculi: Secondary | ICD-10-CM

## 2018-05-30 DIAGNOSIS — N138 Other obstructive and reflux uropathy: Secondary | ICD-10-CM

## 2018-05-30 LAB — BLADDER SCAN AMB NON-IMAGING

## 2018-10-10 ENCOUNTER — Encounter: Payer: Self-pay | Admitting: Internal Medicine

## 2018-10-10 ENCOUNTER — Ambulatory Visit: Payer: Medicare Other | Admitting: Internal Medicine

## 2018-10-10 VITALS — BP 150/86 | HR 62 | Temp 97.7°F | Resp 20 | Wt 157.0 lb

## 2018-10-10 DIAGNOSIS — I10 Essential (primary) hypertension: Secondary | ICD-10-CM | POA: Diagnosis not present

## 2018-10-10 DIAGNOSIS — G479 Sleep disorder, unspecified: Secondary | ICD-10-CM | POA: Insufficient documentation

## 2018-10-10 DIAGNOSIS — K5909 Other constipation: Secondary | ICD-10-CM | POA: Insufficient documentation

## 2018-10-10 DIAGNOSIS — Z87442 Personal history of urinary calculi: Secondary | ICD-10-CM | POA: Diagnosis not present

## 2018-10-10 DIAGNOSIS — Z7189 Other specified counseling: Secondary | ICD-10-CM | POA: Insufficient documentation

## 2018-10-10 MED ORDER — HYDROCHLOROTHIAZIDE 12.5 MG PO CAPS: 12.5000 mg | ORAL_CAPSULE | Freq: Every day | ORAL | 3 refills | Status: DC

## 2018-10-10 NOTE — Assessment & Plan Note (Signed)
See social history DNR done today

## 2018-10-10 NOTE — Assessment & Plan Note (Signed)
Uses melatonin and valerian root  Rare use of unisom or ambien---discussed limiting use

## 2018-10-10 NOTE — Progress Notes (Signed)
Subjective:    Patient ID: Tyler Tapia, male    DOB: August 03, 1921, 85 y.o.   MRN: [id number]  HPI Visit in assisted living apartment to establish care---[facility] Reviewed status with Luellen Pucker RN Records reviewed  Fairly healthy Relatively active--walks daily Still drives and handles his meds Does all ADLs as well  HTN goes back 20 years or so Takes HCTZ every other day Generally will be high when he goes to the doctor's --but goes down after exercise (so he would typically do this before going for a visit) No chest pain No SOB Exercise tolerance is fairly good---used to be state champion running Very little dizziness and no syncope No edema  Chronic sleep issues Uses melatonin regularly and valerian root Will occasionally use unisom (discussed) Has ambien for rare poor night--- 1-2 per month at most  Constipation has been a long standing problem Needs to use routine in AM---2 glasses of warm water with miralax (only 1 teaspoon and not every day) Also uses citrucel Gets pressure sensation at times  Reviewed labs from [facility] Has GFR of ~50 noted  Current Outpatient Medications on File Prior to Visit  Medication Sig Dispense Refill  . docusate sodium (COLACE) 100 MG capsule Take 200 mg by mouth daily.     . hydrochlorothiazide (HYDRODIURIL) 25 MG tablet Take 12.5 mg by mouth every other day.   0  . Melatonin 3 MG TABS Take 1 tablet by mouth at bedtime.    . Multiple Vitamin (MULTI-VITAMINS) TABS Take 1 tablet by mouth daily.    . polyethylene glycol (MIRALAX / GLYCOLAX) packet Take 8.5 g by mouth daily.    Cristino Martes Root 450 MG CAPS Take 3 capsules by mouth at bedtime.    Marland Kitchen zolpidem (AMBIEN) 5 MG tablet Take 5 mg by mouth at bedtime as needed.   0   No current facility-administered medications on file prior to visit.     No Known Allergies  Past Medical History:  Diagnosis Date  . Chronic constipation   . Hyperlipidemia   . Hypertension   . Kidney  stone   . Sleep disturbance     Past Surgical History:  Procedure Laterality Date  . HEMORRHOID SURGERY    . KIDNEY STONE SURGERY      Family History  Problem Relation Age of Onset  . Tuberculosis Mother   . Tuberculosis Father   . Kidney cancer Neg Hx   . Kidney disease Neg Hx   . Prostate cancer Neg Hx     Social History   Socioeconomic History  . Marital status: Widowed    Spouse name: Not on file  . Number of children: 2  . Years of education: Not on file  . Highest education level: Not on file  Occupational History  . 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 Financial resource strain: Not on file  . Food insecurity:    Worry: Not on file    Inability: Not on file  . Transportation needs:    Medical: Not on file    Non-medical: Not on file  Tobacco Use  . Smoking status: Former Smoker    Last attempt to quit: 10/10/1963    Years since quitting: 55.0  . Smokeless tobacco: Never Used  Substance and Sexual Activity  . Alcohol use: Yes    Comment: occasional  . Drug use: Not on file  . Sexual activity: Not on  file  Lifestyle  . Physical activity:    Days per week: Not on file    Minutes per session: Not on file  . Stress: Not on file  Relationships  . Social connections:    Talks on phone: Not on file    Gets together: Not on file    Attends religious service: Not on file    Active member of club or organization: Not on file    Attends meetings of clubs or organizations: Not on file    Relationship status: Not on file  . Intimate partner violence:    Fear of current or ex partner: Not on file    Emotionally abused: Not on file    Physically abused: Not on file    Forced sexual activity: Not on file  Other Topics Concern  . Not on file  Social History Narrative   Wife died in 01-13-2003   2 daughters      Has living will   Daughters would be health care POA   Requests DNR---done  10/10/18   No tube feeds   Review of Systems  Constitutional: Negative for fatigue and unexpected weight change.       Wears seat belt  HENT: Positive for hearing loss and tinnitus. Negative for dental problem.   Eyes: Negative for pain and visual disturbance.  Respiratory: Negative for cough, chest tightness and shortness of breath.   Cardiovascular: Negative for chest pain, palpitations and leg swelling.  Gastrointestinal: Positive for constipation. Negative for blood in stool.       No heartburn  Endocrine: Negative for polydipsia and polyuria.  Genitourinary: Positive for frequency. Negative for urgency.       Flow is okay  Musculoskeletal: Negative for arthralgias, back pain and joint swelling.  Skin:       Past skin cancers Does get regular checks with Dr Nehemiah Massed  Allergic/Immunologic: Negative for environmental allergies and immunocompromised state.  Neurological: Negative for dizziness and syncope.  Hematological: Negative for adenopathy. Does not bruise/bleed easily.  Psychiatric/Behavioral: Positive for sleep disturbance. Negative for dysphoric mood. The patient is not nervous/anxious.        Objective:   Physical Exam         Assessment & Plan:

## 2018-10-10 NOTE — Assessment & Plan Note (Signed)
BP Readings from Last 3 Encounters:  10/10/18 (!) 150/86  05/30/18 134/79  05/13/18 (!) 145/72   Fairly good for his age Will ask him to take 1/2 tab every day---instead of skipping days

## 2018-10-10 NOTE — Assessment & Plan Note (Signed)
Asked him to take miralax every day---a capful May be able to stop all the other treatments

## 2018-10-10 NOTE — Assessment & Plan Note (Signed)
No recent problems  °

## 2019-01-10 ENCOUNTER — Other Ambulatory Visit: Payer: Self-pay | Admitting: Internal Medicine

## 2019-01-10 MED ORDER — ZOLPIDEM TARTRATE 5 MG PO TABS: 5.0000 mg | ORAL_TABLET | Freq: Every evening | ORAL | 0 refills | Status: DC | PRN

## 2019-01-23 IMAGING — CT CT RENAL STONE PROTOCOL
2 of 4 series · 14 of 46 positions shown, 16 images · non-contrast
Comparison: 09/29/2016 plain film exam.  No comparison CT.

CLINICAL DATA: [AGE] male with increasing back pain. Non
radiating. Hypertension. Initial encounter.

EXAM:
CT ABDOMEN AND PELVIS WITHOUT CONTRAST
TECHNIQUE: Multidetector CT imaging of the abdomen and pelvis was performed
following the standard protocol without IV contrast.

[Series 2: stone full standard · axial · 0.78mm/px · z∈[-853,-478]mm · 11 of 91 slices shown, 13 images]
[im 8/91  soft-tissue]
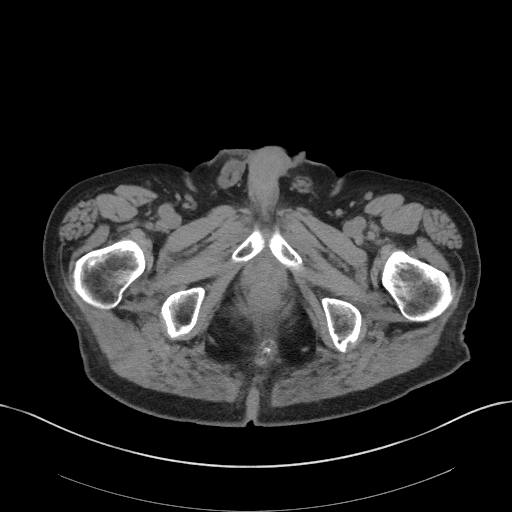
[im 8/91  bone]
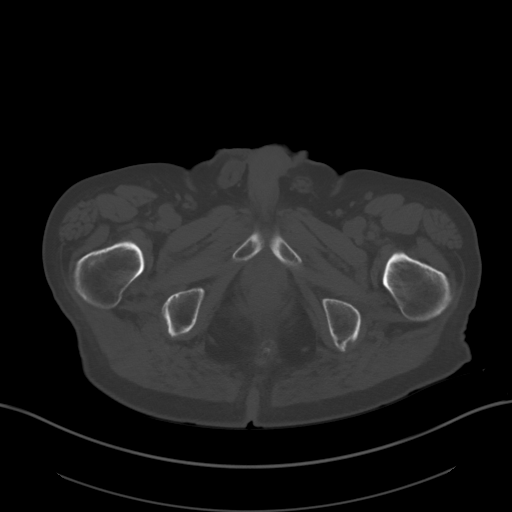
[im 16/91  soft-tissue]
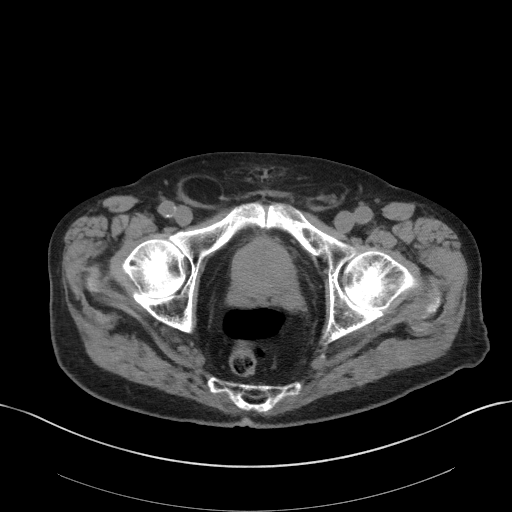
[im 23/91  soft-tissue]
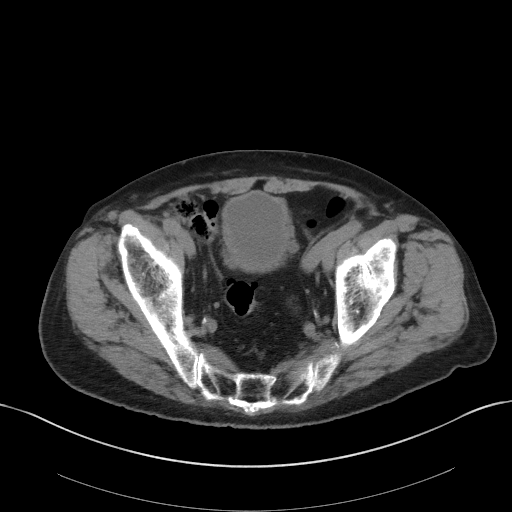
[im 31/91  soft-tissue]
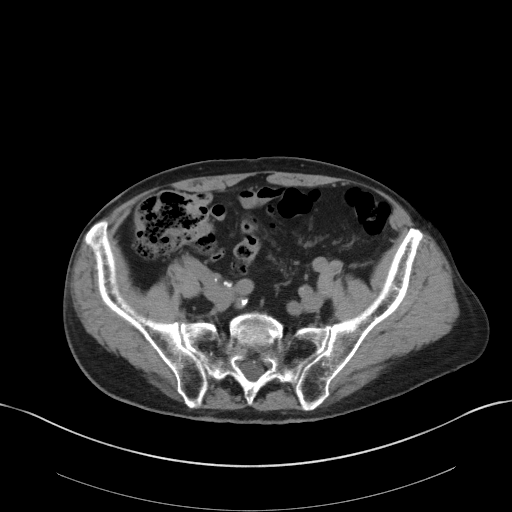
[im 38/91  soft-tissue]
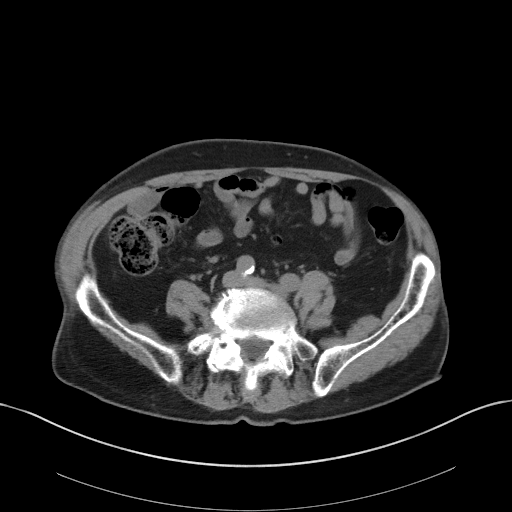
[im 46/91  soft-tissue]
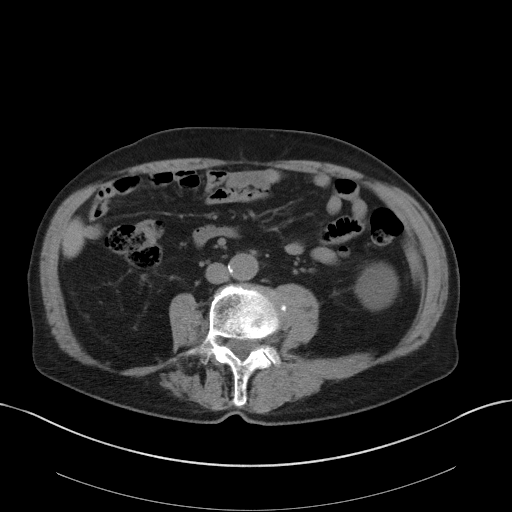
[im 53/91  soft-tissue]
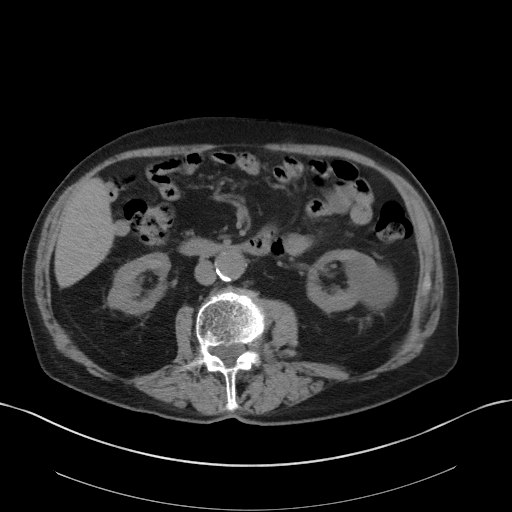
[im 61/91  soft-tissue]
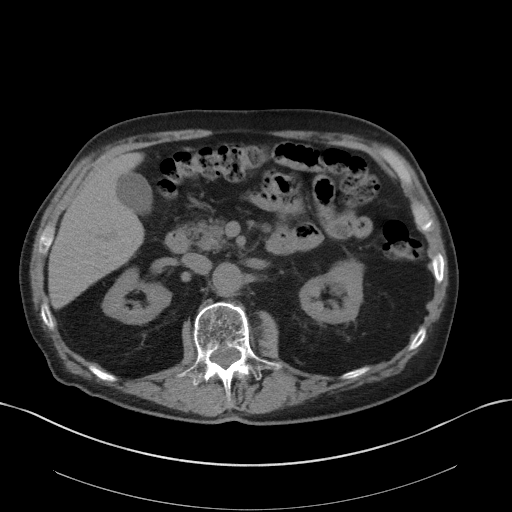
[im 68/91  soft-tissue]
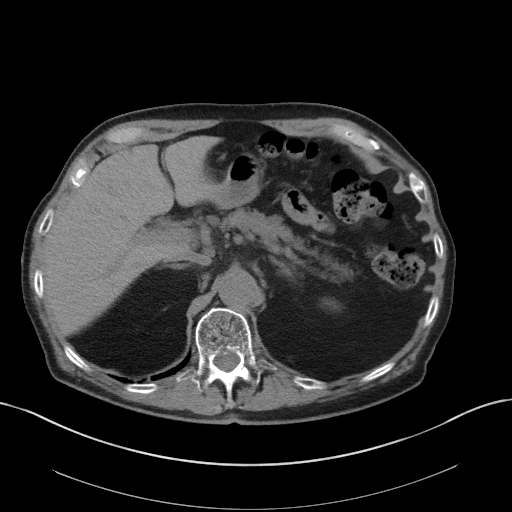
[im 68/91  bone]
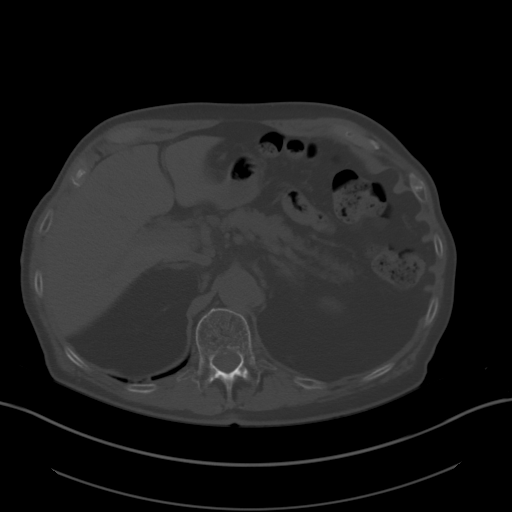
[im 76/91  soft-tissue]
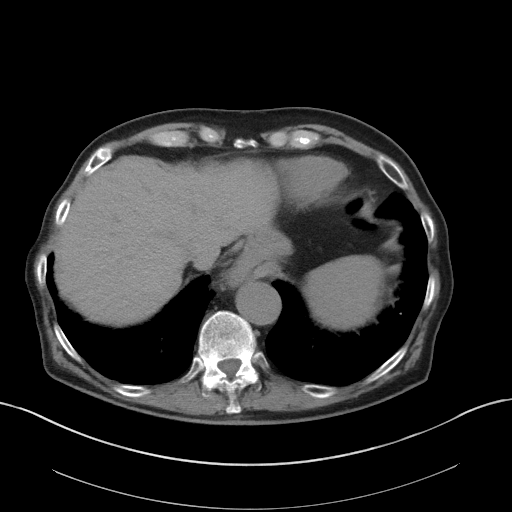
[im 83/91  soft-tissue]
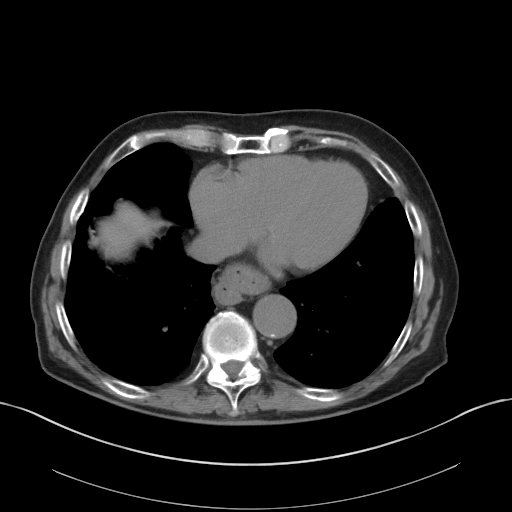

[Series 5: coronal · coronal · 0.67mm/px · 3 of 125 slices shown]
[im 42/125  soft-tissue]
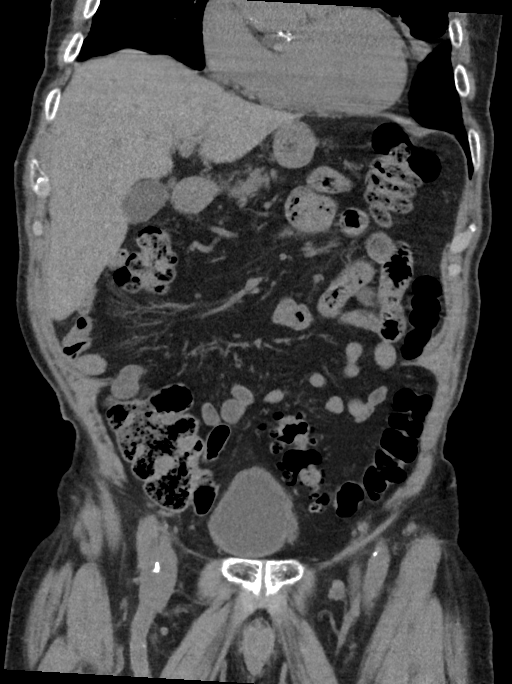
[im 56/125  soft-tissue]
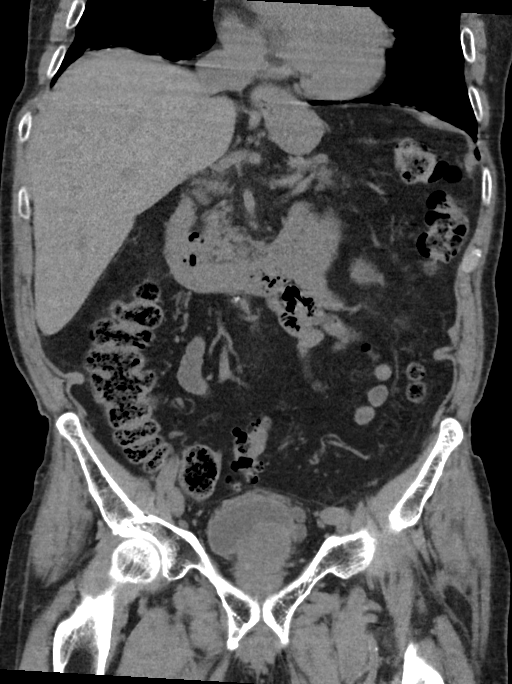
[im 69/125  soft-tissue]
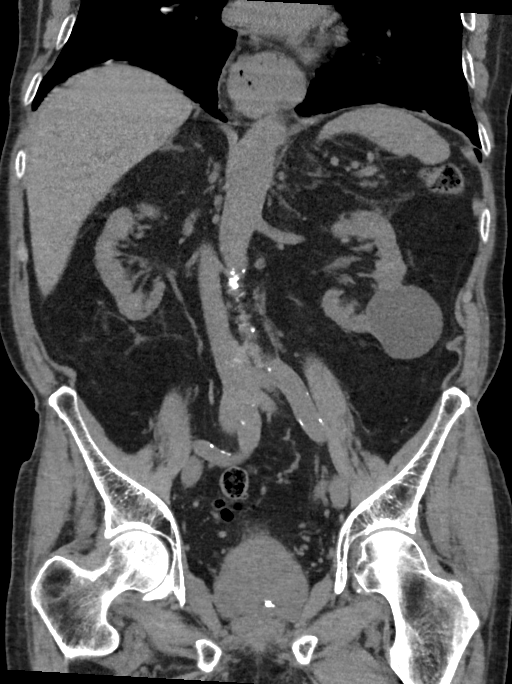

[14 of 46 positions shown; findings below may reference images not displayed]

FINDINGS: Lower chest: Mild basilar atelectasis versus scar. Question tiny
amount of fluid within right fissure. Heart size top-normal. Aortic
valve calcifications. Right coronary artery calcifications. Small to
moderate-size hiatal hernia.

Hepatobiliary: Elongated liver spanning over 19 cm. Probable cyst
anterior left lobe liver. Taking into account limitation by non
contrast imaging, no worrisome hepatic lesion. No calcified
gallstones.

Pancreas: Taking into account limitation by non contrast imaging, no
pancreatic mass or inflammation.

Spleen: Taking into account limitation by non contrast imaging, no
splenic mass or enlargement.

Adrenals/Urinary Tract: No renal or ureteral obstructing stone or
hydronephrosis. 3 x 4 x 5 mm calcification posterior aspect of the
bladder immediately adjacent to right ureteral vesicle junction may
represent a recently passed stone. There is a small stone within a
left lateral bladder diverticulum. Partially decompressed urinary
bladder with circumferential wall thickening greatest posteriorly
and along the dome. Asymmetric enlargement of prostate gland greater
on the left with impression upon the bladder base.

5.5 cm left lower pole renal cyst. Taking into account limitation by
non contrast imaging, no worrisome renal or adrenal lesion.

Stomach/Bowel: Prominent sigmoid diverticulosis without evidence of
diverticulitis. This includes muscular hypertrophy.

Overall, no extraluminal bowel inflammatory process, free fluid or
free air is noted.

Small to moderate-size hiatal hernia. Tiny duodenal diverticulum
incidentally noted.

Vascular/Lymphatic: Atherosclerotic changes aorta with ectasia with
maximal transverse dimension 2.5 cm. Ectatic iliac arteries.

Scattered normal size lymph nodes without adenopathy.

Reproductive: Enlarged prostate gland greater on the left impressing
upon the bladder base.

Other: Small right hydrocele. Fact containing inguinal hernias
greater on the right. No bowel containing hernia.

Musculoskeletal: Scoliosis lumbar spine convex right with
superimposed degenerative changes. Mild loss height L4 superior
endplate. No acute compression fracture.
IMPRESSION: No renal or ureteral obstructing stone or hydronephrosis. 3 x 4 x 5
mm calcification posterior aspect of the bladder immediately
adjacent to right ureteral vesicle junction may represent a recently
passed stone.

Small stone within a left lateral bladder diverticulum. Partially
decompressed urinary bladder with wall thickening greatest
posteriorly and along the dome. Asymmetric enlargement of prostate
gland greater on the left with impression upon the bladder base.

Sigmoid diverticulosis without findings of diverticulitis.

Small to moderate-size hiatal hernia.

Aortic Atherosclerosis ([id number]).

Coronary artery calcifications.

Scoliosis lumbar spine convex right with superimposed degenerative
changes. Mild loss height L4 superior endplate. No acute compression
fracture.

Elongated liver spanning over 19 cm.

## 2019-01-31 ENCOUNTER — Encounter: Payer: Self-pay | Admitting: Internal Medicine

## 2019-01-31 ENCOUNTER — Ambulatory Visit: Payer: Medicare Other | Admitting: Internal Medicine

## 2019-01-31 ENCOUNTER — Other Ambulatory Visit: Payer: Self-pay

## 2019-01-31 DIAGNOSIS — K5909 Other constipation: Secondary | ICD-10-CM

## 2019-01-31 DIAGNOSIS — G479 Sleep disorder, unspecified: Secondary | ICD-10-CM

## 2019-01-31 DIAGNOSIS — I10 Essential (primary) hypertension: Secondary | ICD-10-CM | POA: Diagnosis not present

## 2019-01-31 DIAGNOSIS — E785 Hyperlipidemia, unspecified: Secondary | ICD-10-CM

## 2019-01-31 NOTE — Patient Instructions (Signed)

## 2019-01-31 NOTE — Assessment & Plan Note (Signed)
No statin Encouraged him to consume a low fat diet

## 2019-01-31 NOTE — Assessment & Plan Note (Signed)
Continue Trazadone for now Will monitor

## 2019-01-31 NOTE — Progress Notes (Signed)
Subjective:    Patient ID: Tyler Tapia, male    DOB: 07/28/1921, 85 y.o.   MRN: [id number]  HPI  Resident seen in appt 303 Reviewed with RN, no new concerns Resident reports his memory isn't as good as it used to be but otherwise has settled in well Sleeps well, walks without device. Independent with ADL's.  Appetite good, weight stable, bowels okay Denies c/o pain, reflux or SOB  Sleep Disturbance: He has trouble falling asleep. He takes Trazadone as needed with good results.  HTN: He is taking HZTZ as prescribed. He denies frequent headaches, dizziness or visual changes.  Chronic Constipation: Bowels moving okay with the use of Colace and Mirilax. He tries to consume an adequate amount of water.  HLD: Currently not on a statin. He consume a low fat diet.  Review of Systems  Past Medical History:  Diagnosis Date  . Chronic constipation   . Hyperlipidemia   . Hypertension   . Kidney stone   . Sleep disturbance     Current Outpatient Medications  Medication Sig Dispense Refill  . docusate sodium (COLACE) 100 MG capsule Take 200 mg by mouth daily.     . hydrochlorothiazide (MICROZIDE) 12.5 MG capsule Take 1 capsule (12.5 mg total) by mouth daily. 90 capsule 3  . Melatonin 3 MG TABS Take 1 tablet by mouth at bedtime.    . Multiple Vitamin (MULTI-VITAMINS) TABS Take 1 tablet by mouth daily.    . polyethylene glycol (MIRALAX / GLYCOLAX) packet Take 8.5 g by mouth daily.    . traZODone (DESYREL) 50 MG tablet 50 mg at bedtime.    Tyler Tapia Root 450 MG CAPS Take 3 capsules by mouth at bedtime.     No current facility-administered medications for this visit.     No Known Allergies  Family History  Problem Relation Age of Onset  . Tuberculosis Mother   . COPD Father        from mustard gas in [facility]  . Kidney cancer Neg Hx   . Kidney disease Neg Hx   . Prostate cancer Neg Hx   . Diabetes Neg Hx   . Heart disease Neg Hx     Social History   Socioeconomic History   . Marital status: Widowed    Spouse name: Not on file  . Number of children: 2  . Years of education: Not on file  . Highest education level: Not on file  Occupational History  . 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 Financial resource strain: Not on file  . Food insecurity:    Worry: Not on file    Inability: Not on file  . Transportation needs:    Medical: Not on file    Non-medical: Not on file  Tobacco Use  . Smoking status: Former Smoker    Last attempt to quit: 10/10/1963    Years since quitting: 55.3  . Smokeless tobacco: Never Used  Substance and Sexual Activity  . Alcohol use: Yes    Comment: occasional  . Drug use: Not on file  . Sexual activity: Not on file  Lifestyle  . Physical activity:    Days per week: Not on file    Minutes per session: Not on file  . Stress: Not on file  Relationships  . Social connections:    Talks on phone: Not on file    Gets together: Not on file  Attends religious service: Not on file    Active member of club or organization: Not on file    Attends meetings of clubs or organizations: Not on file    Relationship status: Not on file  . Intimate partner violence:    Fear of current or ex partner: Not on file    Emotionally abused: Not on file    Physically abused: Not on file    Forced sexual activity: Not on file  Other Topics Concern  . Not on file  Social History Narrative   Wife died in 01/15/2003   2 daughters      Has living will   Daughters would be health care POA   Requests DNR---done 10/10/18   No tube feeds     Constitutional: Denies fever, malaise, fatigue, headache or abrupt weight changes.  HEENT: Denies eye pain, eye redness, ear pain, ringing in the ears, wax buildup, runny nose, nasal congestion, bloody nose, or sore throat. Respiratory: Denies difficulty breathing, shortness of breath, cough or sputum production.   Cardiovascular:  Denies chest pain, chest tightness, palpitations or swelling in the hands or feet.  Gastrointestinal: Pt reports intermittent constipation. Denies abdominal pain, bloating, constipation, diarrhea or blood in the stool.  GU: Denies urgency, frequency, pain with urination, burning sensation, blood in urine, odor or discharge. Musculoskeletal: Denies decrease in range of motion, difficulty with gait, muscle pain or joint pain and swelling.  Skin: Denies redness, rashes, lesions or ulcercations.  Neurological: Pt reports difficulty falling asleep, difficulty with memory. Denies dizziness,  difficulty with speech or problems with balance and coordination.  Psych: Denies anxiety, depression, SI/HI.  No other specific complaints in a complete review of systems (except as listed in HPI above).     Objective:   Physical Exam    BP (!) 162/98   Pulse 83   Temp 98.2 F (36.8 C)   Resp 18   Wt 151 lb 9.6 oz (68.8 kg)   BMI 21.75 kg/m  Wt Readings from Last 3 Encounters:  01/31/19 151 lb 9.6 oz (68.8 kg)  10/10/18 157 lb (71.2 kg)  05/30/18 150 lb (68 kg)    General: Appears his stated age, well developed, well nourished in NAD. Skin: Warm, dry and intact.  Cardiovascular: Normal rate and rhythm. S1,S2 noted.  No murmur, rubs or gallops noted. No JVD or BLE edema.  Pulmonary/Chest: Normal effort and positive vesicular breath sounds. No respiratory distress. No wheezes, rales or ronchi noted.  Abdomen: Soft and nontender.  Musculoskeletal: No difficulty with gait.  Neurological: Alert and oriented.  Psychiatric: Mood and affect normal. Behavior is normal. Judgment and thought content normal.    BMET    Component Value Date/Time   NA 140 05/13/2018 0621   K 3.5 05/13/2018 0621   CL 101 05/13/2018 0621   CO2 30 05/13/2018 0621   GLUCOSE 105 (H) 05/13/2018 0621   BUN 17 05/13/2018 0621   CREATININE 1.18 05/13/2018 0621   CALCIUM 8.9 05/13/2018 0621   GFRNONAA 51 (L) 05/13/2018 0621    GFRAA 59 (L) 05/13/2018 0621    Lipid Panel  No results found for: CHOL, TRIG, HDL, CHOLHDL, VLDL, LDLCALC  CBC    Component Value Date/Time   WBC 6.1 05/13/2018 0621   RBC 4.23 (L) 05/13/2018 0621   HGB 13.5 05/13/2018 0621   HCT 38.8 (L) 05/13/2018 0621   PLT 375 05/13/2018 0621   MCV 91.9 05/13/2018 0621   MCH 31.9 05/13/2018 3710  MCHC 34.7 05/13/2018 0621   RDW 12.9 05/13/2018 0621   LYMPHSABS 1.6 07/30/2017 0638   MONOABS 0.8 07/30/2017 0638   EOSABS 0.1 07/30/2017 0638   BASOSABS 0.1 07/30/2017 0638    Hgb A1C No results found for: HGBA1C        Assessment & Plan:

## 2019-01-31 NOTE — Assessment & Plan Note (Signed)
Controlled on HCTZ Will monitor

## 2019-01-31 NOTE — Assessment & Plan Note (Signed)
Continue Colace and Mirilax Encouraged adequate water intake

## 2019-02-27 LAB — CBC AND DIFFERENTIAL
HCT: 37 — AB (ref 41–53)
HCT: 37 — AB (ref 41–53)
Hemoglobin: 12.2 — AB (ref 13.5–17.5)
Hemoglobin: 12.2 — AB (ref 13.5–17.5)

## 2019-02-27 LAB — BASIC METABOLIC PANEL
BUN: 1 — AB (ref 4–21)
Creatinine: 1.2 (ref 0.6–1.3)

## 2019-04-17 ENCOUNTER — Ambulatory Visit: Payer: Medicare Other | Admitting: Internal Medicine

## 2019-04-17 ENCOUNTER — Encounter: Payer: Self-pay | Admitting: Internal Medicine

## 2019-04-17 VITALS — BP 153/90 | HR 64 | Resp 22 | Wt 149.6 lb

## 2019-04-17 DIAGNOSIS — G479 Sleep disorder, unspecified: Secondary | ICD-10-CM

## 2019-04-17 DIAGNOSIS — I358 Other nonrheumatic aortic valve disorders: Secondary | ICD-10-CM | POA: Diagnosis not present

## 2019-04-17 DIAGNOSIS — N183 Chronic kidney disease, stage 3 unspecified: Secondary | ICD-10-CM

## 2019-04-17 DIAGNOSIS — I10 Essential (primary) hypertension: Secondary | ICD-10-CM

## 2019-04-17 DIAGNOSIS — I350 Nonrheumatic aortic (valve) stenosis: Secondary | ICD-10-CM | POA: Insufficient documentation

## 2019-04-17 DIAGNOSIS — K5909 Other constipation: Secondary | ICD-10-CM

## 2019-04-17 DIAGNOSIS — N1831 Chronic kidney disease, stage 3a: Secondary | ICD-10-CM | POA: Insufficient documentation

## 2019-04-17 NOTE — Assessment & Plan Note (Signed)
Has decreased use of unisom and ambien This is preferable--again discussed potential problems with these

## 2019-04-17 NOTE — Assessment & Plan Note (Signed)
Sounds like AS---but no symptoms No further action unless he gets chest pain, dizziness, etc

## 2019-04-17 NOTE — Progress Notes (Addendum)
Subjective:    Patient ID: Tyler Tapia, male    DOB: 1921/03/29, 85 y.o.   MRN: [id number]  HPI Visit in assisted living apartment for review of chronic health conditions Reviewed status with Luellen Pucker RN  Doing fairly well Trying to deal with the pandemic Gets out daily for his walk--at a good pace Discussed resistance work  No chest pain No SOB No dizziness or syncope No edema He monitors his BP--- 145/76 is typical  Ongoing sleep issues Uses chamomile tea, valerian root and low dose melatonin/trazodone nightly Will occasionally use unisom and rarely ambien No AM grogginess after these  Ongoing bowel issues Uses miralax --but not regularly (had done it for a while) Does use metamucil, 16 ounces warm water ----usually will give him 2-3 small bowel movements within 2 hours Has tried colace as well--but unclear if it helps  Reviewed labs GFR in 50's  Current Outpatient Medications on File Prior to Visit  Medication Sig Dispense Refill  . docusate sodium (COLACE) 100 MG capsule Take 200 mg by mouth daily.     . hydrochlorothiazide (MICROZIDE) 12.5 MG capsule Take 1 capsule (12.5 mg total) by mouth daily. 90 capsule 3  . Melatonin 3 MG TABS Take 1 tablet by mouth at bedtime.    . Multiple Vitamin (MULTI-VITAMINS) TABS Take 1 tablet by mouth daily.    . polyethylene glycol (MIRALAX / GLYCOLAX) packet Take 8.5 g by mouth daily.    . traZODone (DESYREL) 50 MG tablet 50 mg at bedtime.    Cristino Martes Root 450 MG CAPS Take 3 capsules by mouth at bedtime.     No current facility-administered medications on file prior to visit.     No Known Allergies  Past Medical History:  Diagnosis Date  . Chronic constipation   . Hyperlipidemia   . Hypertension   . Kidney stone   . Sleep disturbance     Past Surgical History:  Procedure Laterality Date  . CATARACT EXTRACTION W/ INTRAOCULAR LENS IMPLANT Bilateral   . HEMORRHOID SURGERY    . KIDNEY STONE SURGERY      Family  History  Problem Relation Age of Onset  . Tuberculosis Mother   . COPD Father        from mustard gas in [facility]  . Kidney cancer Neg Hx   . Kidney disease Neg Hx   . Prostate cancer Neg Hx   . Diabetes Neg Hx   . Heart disease Neg Hx     Social History   Socioeconomic History  . Marital status: Widowed    Spouse name: Not on file  . Number of children: 2  . Years of education: Not on file  . Highest education level: Not on file  Occupational History  . 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 Financial resource strain: Not on file  . Food insecurity    Worry: Not on file    Inability: Not on file  . Transportation needs    Medical: Not on file    Non-medical: Not on file  Tobacco Use  . Smoking status: Former Smoker    Quit date: 10/10/1963    Years since quitting: 55.5  . Smokeless tobacco: Never Used  Substance and Sexual Activity  . Alcohol use: Yes    Comment: occasional  . Drug use: Not on file  . Sexual activity: Not on file  Lifestyle  . Physical activity  Days per week: Not on file    Minutes per session: Not on file  . Stress: Not on file  Relationships  . Social Herbalist on phone: Not on file    Gets together: Not on file    Attends religious service: Not on file    Active member of club or organization: Not on file    Attends meetings of clubs or organizations: Not on file    Relationship status: Not on file  . Intimate partner violence    Fear of current or ex partner: Not on file    Emotionally abused: Not on file    Physically abused: Not on file    Forced sexual activity: Not on file  Other Topics Concern  . Not on file  Social History Narrative   Wife died in 01/13/2003   2 daughters      Has living will   Daughters would be health care POA   Requests DNR---done 10/10/18   No tube feeds   Review of Systems Appetite is not big--but weight is holding Voids  okay--but does note frequency in day Usually no nocturia Some dry skin--flaky. aveeno cream helps     Objective:   Physical Exam  Constitutional: He appears well-developed. No distress.  Neck: No thyromegaly present.  Cardiovascular: Normal rate and regular rhythm. Exam reveals no gallop.  Soft aortic systolic murmur  Respiratory: Effort normal and breath sounds normal. No respiratory distress. He has no wheezes. He has no rales.  GI: Soft. There is no abdominal tenderness.  Musculoskeletal:        General: No tenderness or edema.  Lymphadenopathy:    He has no cervical adenopathy.  Skin: No rash noted.  Mild dry skin           Assessment & Plan:

## 2019-04-17 NOTE — Assessment & Plan Note (Signed)
Again recommended daily miralax with his warm water and metamucil No major issues here

## 2019-04-17 NOTE — Assessment & Plan Note (Signed)
Mild No changes needed

## 2019-04-17 NOTE — Assessment & Plan Note (Signed)
BP Readings from Last 3 Encounters:  04/17/19 (!) 153/90  01/31/19 (!) 162/98  10/10/18 (!) 150/86   Reasonable control for his age No changes needed Will check labs yearly

## 2019-06-13 ENCOUNTER — Encounter: Payer: Self-pay | Admitting: Internal Medicine

## 2019-08-27 ENCOUNTER — Encounter: Payer: Self-pay | Admitting: Internal Medicine

## 2019-08-27 ENCOUNTER — Other Ambulatory Visit: Payer: Self-pay

## 2019-08-27 ENCOUNTER — Ambulatory Visit: Payer: Medicare Other | Admitting: Internal Medicine

## 2019-08-27 DIAGNOSIS — I10 Essential (primary) hypertension: Secondary | ICD-10-CM | POA: Diagnosis not present

## 2019-08-27 DIAGNOSIS — K5909 Other constipation: Secondary | ICD-10-CM

## 2019-08-27 DIAGNOSIS — N1831 Chronic kidney disease, stage 3a: Secondary | ICD-10-CM

## 2019-08-27 DIAGNOSIS — G479 Sleep disorder, unspecified: Secondary | ICD-10-CM | POA: Diagnosis not present

## 2019-08-27 DIAGNOSIS — E785 Hyperlipidemia, unspecified: Secondary | ICD-10-CM

## 2019-08-27 NOTE — Assessment & Plan Note (Signed)
BP today is 130/79 Controlled, continue taking HCTZ 12.5 mg as prescribed.

## 2019-08-27 NOTE — Patient Instructions (Signed)

## 2019-08-27 NOTE — Progress Notes (Signed)
Subjective:    Patient ID: Tyler Tapia, male    DOB: 05-18-1921, 85 y.o.   MRN: [id number]  HPI   Resident seen in [address].  No new concerns from staff. Patient states he feels well without any new concerns or worries.. Patient sleeps fairly well and takes Melatonin at times to help. Appetite is fair. Weight is 147.9 (down approx. 2lbs). Patient can ambulate without assistive device. Bowels are ok and uses Mirilax daily. Patient is taking all of his medications as prescribed without issues. Patient denies CP, SOB, or Reflux.    HTN: BP today is 130/79. Controlled, taking HCTZ.  GERD: Denies pain or reflux. Takes Femontadine as needed.  Isomnia: Sleeps well, on scheduled Trazodone and Melatonin with Ambien PRN.    Review of Systems      Past Medical History:  Diagnosis Date  . Chronic constipation   . Hyperlipidemia   . Hypertension   . Kidney stone   . Sleep disturbance     Current Outpatient Medications  Medication Sig Dispense Refill  . docusate sodium (COLACE) 100 MG capsule Take 200 mg by mouth daily.     . hydrochlorothiazide (MICROZIDE) 12.5 MG capsule Take 1 capsule (12.5 mg total) by mouth daily. 90 capsule 3  . Melatonin 3 MG TABS Take 1 tablet by mouth at bedtime.    . Multiple Vitamin (MULTI-VITAMINS) TABS Take 1 tablet by mouth daily.    . polyethylene glycol (MIRALAX / GLYCOLAX) packet Take 8.5 g by mouth daily.    . traZODone (DESYREL) 50 MG tablet 50 mg at bedtime.    Cristino Martes Root 450 MG CAPS Take 3 capsules by mouth at bedtime.     No current facility-administered medications for this visit.     No Known Allergies  Family History  Problem Relation Age of Onset  . Tuberculosis Mother   . COPD Father        from mustard gas in [facility]  . Kidney cancer Neg Hx   . Kidney disease Neg Hx   . Prostate cancer Neg Hx   . Diabetes Neg Hx   . Heart disease Neg Hx     Social History   Socioeconomic History  . Marital status: Widowed    Spouse name:  Not on file  . Number of children: 2  . Years of education: Not on file  . Highest education level: Not on file  Occupational History  . 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 Financial resource strain: Not on file  . Food insecurity    Worry: Not on file    Inability: Not on file  . Transportation needs    Medical: Not on file    Non-medical: Not on file  Tobacco Use  . Smoking status: Former Smoker    Quit date: 10/10/1963    Years since quitting: 55.9  . Smokeless tobacco: Never Used  Substance and Sexual Activity  . Alcohol use: Yes    Comment: occasional  . Drug use: Not on file  . Sexual activity: Not on file  Lifestyle  . Physical activity    Days per week: Not on file    Minutes per session: Not on file  . Stress: Not on file  Relationships  . Social Herbalist on phone: Not on file    Gets together: Not on file    Attends religious service: Not on file  Active member of club or organization: Not on file    Attends meetings of clubs or organizations: Not on file    Relationship status: Not on file  . Intimate partner violence    Fear of current or ex partner: Not on file    Emotionally abused: Not on file    Physically abused: Not on file    Forced sexual activity: Not on file  Other Topics Concern  . Not on file  Social History Narrative   Wife died in 01-28-03   2 daughters      Has living will   Daughters would be health care POA   Requests DNR---done 10/10/18   No tube feeds     Constitutional: Denies fever, malaise, fatigue, headache or abrupt weight changes.  HEENT: Denies eye pain, eye redness, ear pain, ringing in the ears, wax buildup, runny nose, nasal congestion, bloody nose, or sore throat. Respiratory: Denies difficulty breathing, shortness of breath, cough or sputum production.   Cardiovascular: Denies chest pain, chest tightness, palpitations or swelling in  the hands or feet.  Gastrointestinal: Pt reports intermittent constipation. Denies abdominal pain, bloating, diarrhea or blood in the stool.  GU: Denies urgency, frequency, pain with urination, burning sensation, blood in urine, odor or discharge. Musculoskeletal: Denies decrease in range of motion, difficulty with gait, muscle pain or joint pain and swelling.  Skin: Denies redness, rashes, lesions or ulcercations.  Neurological: Pt reports insomnia. Denies dizziness, difficulty with memory, difficulty with speech or problems with balance and coordination.  Psych: Denies anxiety, depression, SI/HI.  No other specific complaints in a complete review of systems (except as listed in HPI above).  Objective:   Physical Exam   BP 130/79   Pulse 79   Temp 98.7 F (37.1 C)   Resp 19   Wt 147 lb 14.4 oz (67.1 kg)   BMI 21.22 kg/m  Wt Readings from Last 3 Encounters:  08/27/19 147 lb 14.4 oz (67.1 kg)  04/17/19 149 lb 9.6 oz (67.9 kg)  01/31/19 151 lb 9.6 oz (68.8 kg)    General: Appears his stated age, engages fully,  in NAD. Skin: Warm, dry and intact. No ulcerations noted. Neck:  Neck supple, trachea midline. No masses, lumps or thyromegaly present.  Cardiovascular: Normal rate and rhythm. S1,S2 noted.  No murmur, rubs or gallops noted. No JVD or BLE edema.  Pulmonary/Chest: Normal effort and positive vesicular breath sounds. No respiratory distress. No wheezes, rales or ronchi noted.  Abdomen: Soft and nontender. Normal bowel sounds.  Musculoskeletal:  5+ strength bilateral upper/lower extremities. No difficulty with gait. Neurological: Alert and oriented.    BMET    Component Value Date/Time   NA 140 05/13/2018 0621   K 3.5 05/13/2018 0621   CL 101 05/13/2018 0621   CO2 30 05/13/2018 0621   GLUCOSE 105 (H) 05/13/2018 0621   BUN 1 (A) 02/27/2019   CREATININE 1.2 02/27/2019   CREATININE 1.18 05/13/2018 0621   CALCIUM 8.9 05/13/2018 0621   GFRNONAA 51 (L) 05/13/2018 0621    GFRAA 59 (L) 05/13/2018 0621    Lipid Panel  No results found for: CHOL, TRIG, HDL, CHOLHDL, VLDL, LDLCALC  CBC    Component Value Date/Time   WBC 6.1 05/13/2018 0621   RBC 4.23 (L) 05/13/2018 0621   HGB 12.2 (A) 02/27/2019   HGB 12.2 (A) 02/27/2019   HCT 37 (A) 02/27/2019   HCT 37 (A) 02/27/2019   PLT 375 05/13/2018 0621   MCV 91.9  05/13/2018 0621   MCH 31.9 05/13/2018 0621   MCHC 34.7 05/13/2018 0621   RDW 12.9 05/13/2018 0621   LYMPHSABS 1.6 07/30/2017 0638   MONOABS 0.8 07/30/2017 0638   EOSABS 0.1 07/30/2017 0638   BASOSABS 0.1 07/30/2017 0638    Hgb A1C No results found for: HGBA1C        Assessment & Plan:

## 2019-08-27 NOTE — Assessment & Plan Note (Addendum)
No new issues or concerns.  Continue with Trazodone 50mg  and Melatonin 3mg  for sleep.  Will try to D/C Ambien

## 2019-08-27 NOTE — Assessment & Plan Note (Signed)
No new concern today. Continue taking Colace 100 mg BID to soften stool. Continue Miralax 8.5 g daily to aid with constipation

## 2019-08-27 NOTE — Assessment & Plan Note (Signed)
No recent LDL on file.  Encouraged to ontinue to eat a healthy and low-fat diet.

## 2019-08-27 NOTE — Assessment & Plan Note (Signed)
No ACEI/ARB Will monitor

## 2019-10-23 ENCOUNTER — Encounter: Payer: Self-pay | Admitting: Internal Medicine

## 2019-10-23 ENCOUNTER — Other Ambulatory Visit: Payer: Self-pay

## 2019-10-23 ENCOUNTER — Ambulatory Visit: Payer: Medicare Other | Admitting: Internal Medicine

## 2019-10-23 VITALS — BP 155/86 | HR 51 | Temp 96.8°F | Resp 17 | Wt 149.0 lb

## 2019-10-23 DIAGNOSIS — I10 Essential (primary) hypertension: Secondary | ICD-10-CM

## 2019-10-23 DIAGNOSIS — N1831 Chronic kidney disease, stage 3a: Secondary | ICD-10-CM | POA: Diagnosis not present

## 2019-10-23 DIAGNOSIS — K5909 Other constipation: Secondary | ICD-10-CM

## 2019-10-23 DIAGNOSIS — G479 Sleep disorder, unspecified: Secondary | ICD-10-CM

## 2019-10-23 DIAGNOSIS — I358 Other nonrheumatic aortic valve disorders: Secondary | ICD-10-CM | POA: Diagnosis not present

## 2019-10-23 NOTE — Assessment & Plan Note (Signed)
Sounds like mild AS No symptoms--would not look into this at his age

## 2019-10-23 NOTE — Assessment & Plan Note (Signed)
BP Readings from Last 3 Encounters:  10/23/19 (!) 155/86  08/27/19 130/79  04/17/19 (!) 153/90   Good control for his age Variable but don't want to be too low with his age and likely AS

## 2019-10-23 NOTE — Assessment & Plan Note (Signed)
Doing fine on his regimen without obvious side effects

## 2019-10-23 NOTE — Progress Notes (Signed)
Subjective:    Patient ID: Tyler Tapia, male    DOB: 04-Jun-1921, 86 y.o.   MRN: [id number]  HPI Visit in his assisted living apartment for review of chronic health conditions Reviewed status with Luellen Pucker, RN  Has been monitoring his BP regularly AM BP averages 145/76 After his daily 30 minute walk--- 132/75 (after recovery) No chest pain or SOB No palpitations No dizziness or syncope  Continues on his nighttime routine [place] tea, valerian and trazodone No AM fatigue or grogginess  Bowels are moving okay Is taking the miralax daily now--not a full   Current Outpatient Medications on File Prior to Visit  Medication Sig Dispense Refill  . docusate sodium (COLACE) 100 MG capsule Take 200 mg by mouth daily.     . hydrochlorothiazide (MICROZIDE) 12.5 MG capsule Take 1 capsule (12.5 mg total) by mouth daily. 90 capsule 3  . Melatonin 3 MG TABS Take 1 tablet by mouth at bedtime.    . Multiple Vitamin (MULTI-VITAMINS) TABS Take 1 tablet by mouth daily.    . polyethylene glycol (MIRALAX / GLYCOLAX) packet Take 8.5 g by mouth daily.    . traZODone (DESYREL) 50 MG tablet 50 mg at bedtime.    Cristino Martes Root 450 MG CAPS Take 3 capsules by mouth at bedtime.     No current facility-administered medications on file prior to visit.    No Known Allergies  Past Medical History:  Diagnosis Date  . Chronic constipation   . Hyperlipidemia   . Hypertension   . Kidney stone   . Sleep disturbance     Past Surgical History:  Procedure Laterality Date  . CATARACT EXTRACTION W/ INTRAOCULAR LENS IMPLANT Bilateral   . HEMORRHOID SURGERY    . KIDNEY STONE SURGERY      Family History  Problem Relation Age of Onset  . Tuberculosis Mother   . COPD Father        from mustard gas in [facility]  . Kidney cancer Neg Hx   . Kidney disease Neg Hx   . Prostate cancer Neg Hx   . Diabetes Neg Hx   . Heart disease Neg Hx     Social History   Socioeconomic History  . Marital status: Widowed      Spouse name: Not on file  . Number of children: 2  . Years of education: Not on file  . Highest education level: Not on file  Occupational History  . 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 Smoking status: Former Smoker    Quit date: 10/10/1963    Years since quitting: 56.0  . Smokeless tobacco: Never Used  Substance and Sexual Activity  . Alcohol use: Yes    Comment: occasional  . Drug use: Not on file  . Sexual activity: Not on file  Other Topics Concern  . Not on file  Social History Narrative   Wife died in 01/17/03   2 daughters      Has living will   Daughters would be health care POA   Requests DNR---done 10/10/18   No tube feeds   Social Determinants of Health   Financial Resource Strain:   . Difficulty of Paying Living Expenses: Not on file  Food Insecurity:   . Worried About Charity fundraiser in the Last Year: Not on file  . Ran Out of Food in the Last Year: Not on file  Transportation Needs:   .  Lack of Transportation (Medical): Not on file  . Lack of Transportation (Non-Medical): Not on file  Physical Activity:   . Days of Exercise per Week: Not on file  . Minutes of Exercise per Session: Not on file  Stress:   . Feeling of Stress : Not on file  Social Connections:   . Frequency of Communication with Friends and Family: Not on file  . Frequency of Social Gatherings with Friends and Family: Not on file  . Attends Religious Services: Not on file  . Active Member of Clubs or Organizations: Not on file  . Attends Archivist Meetings: Not on file  . Marital Status: Not on file  Intimate Partner Violence:   . Fear of Current or Ex-Partner: Not on file  . Emotionally Abused: Not on file  . Physically Abused: Not on file  . Sexually Abused: Not on file   Review of Systems Appetite is not big--but he "gets enough" Weight is  Voids okay---just some frequency. Nocturia is  rare Some anxiety about COVID, etc--but no depression No sig back or joint pains    Objective:   Physical Exam  Constitutional: He appears well-developed. No distress.  Neck: No thyromegaly present.  Cardiovascular: Normal rate and regular rhythm. Exam reveals no gallop.  Gr 2/6 systolic murmur at base  Respiratory: Effort normal and breath sounds normal. No respiratory distress. He has no wheezes. He has no rales.  GI: Soft. There is no abdominal tenderness.  Musculoskeletal:        General: No edema.  Lymphadenopathy:    He has no cervical adenopathy.  Psychiatric: He has a normal mood and affect. His behavior is normal.           Assessment & Plan:

## 2019-10-23 NOTE — Assessment & Plan Note (Signed)
Doing well with the miralax

## 2019-10-23 NOTE — Assessment & Plan Note (Signed)
Will check yearly

## 2020-02-04 ENCOUNTER — Other Ambulatory Visit: Payer: Self-pay

## 2020-02-04 ENCOUNTER — Ambulatory Visit: Payer: Medicare Other | Admitting: Internal Medicine

## 2020-02-04 DIAGNOSIS — I10 Essential (primary) hypertension: Secondary | ICD-10-CM | POA: Diagnosis not present

## 2020-02-04 DIAGNOSIS — G479 Sleep disorder, unspecified: Secondary | ICD-10-CM | POA: Diagnosis not present

## 2020-02-08 ENCOUNTER — Encounter: Payer: Self-pay | Admitting: Internal Medicine

## 2020-02-08 NOTE — Assessment & Plan Note (Signed)
Continue Melatonin and Trazadone ?

## 2020-02-08 NOTE — Progress Notes (Signed)
Subjective:    Patient ID: Tyler Tapia, male    DOB: 01-Jun-1921, 86 y.o.   MRN: [id number]  HPI  Resident seen for routine follow up in [address] No new concerns from staff. Resident reports mass in right groin. He reports this has been there for years. It is not tender. He reports he was once told it was a hernia.  HTN: Controlled on HCTZ  Insomnia: He sleeps well with Melatonin and Trazadone.   Review of Systems  Past Medical History:  Diagnosis Date  . Chronic constipation   . Hyperlipidemia   . Hypertension   . Kidney stone   . Sleep disturbance     Current Outpatient Medications  Medication Sig Dispense Refill  . docusate sodium (COLACE) 100 MG capsule Take 200 mg by mouth daily.     . hydrochlorothiazide (MICROZIDE) 12.5 MG capsule Take 1 capsule (12.5 mg total) by mouth daily. 90 capsule 3  . Melatonin 3 MG TABS Take 1 tablet by mouth at bedtime.    . Multiple Vitamin (MULTI-VITAMINS) TABS Take 1 tablet by mouth daily.    . polyethylene glycol (MIRALAX / GLYCOLAX) packet Take 8.5 g by mouth daily.    . traZODone (DESYREL) 50 MG tablet 50 mg at bedtime.    Cristino Martes Root 450 MG CAPS Take 3 capsules by mouth at bedtime.     No current facility-administered medications for this visit.    No Known Allergies  Family History  Problem Relation Age of Onset  . Tuberculosis Mother   . COPD Father        from mustard gas in [facility]  . Kidney cancer Neg Hx   . Kidney disease Neg Hx   . Prostate cancer Neg Hx   . Diabetes Neg Hx   . Heart disease Neg Hx     Social History   Socioeconomic History  . Marital status: Widowed    Spouse name: Not on file  . Number of children: 2  . Years of education: Not on file  . Highest education level: Not on file  Occupational History  . 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 Smoking status: Former Smoker    Quit date: 10/10/1963    Years since  quitting: 56.3  . Smokeless tobacco: Never Used  Substance and Sexual Activity  . Alcohol use: Yes    Comment: occasional  . Drug use: Not on file  . Sexual activity: Not on file  Other Topics Concern  . Not on file  Social History Narrative   Wife died in 01-25-03   2 daughters      Has living will   Daughters would be health care POA   Requests DNR---done 10/10/18   No tube feeds   Social Determinants of Health   Financial Resource Strain:   . Difficulty of Paying Living Expenses:   Food Insecurity:   . Worried About Charity fundraiser in the Last Year:   . Arboriculturist in the Last Year:   Transportation Needs:   . Film/video editor (Medical):   Marland Kitchen Lack of Transportation (Non-Medical):   Physical Activity:   . Days of Exercise per Week:   . Minutes of Exercise per Session:   Stress:   . Feeling of Stress :   Social Connections:   . Frequency of Communication with Friends and Family:   . Frequency of Social Gatherings  with Friends and Family:   . Attends Religious Services:   . Active Member of Clubs or Organizations:   . Attends Archivist Meetings:   Marland Kitchen Marital Status:   Intimate Partner Violence:   . Fear of Current or Ex-Partner:   . Emotionally Abused:   Marland Kitchen Physically Abused:   . Sexually Abused:      Constitutional: Denies fever, malaise, fatigue, headache or abrupt weight changes.  HEENT: Denies eye pain, eye redness, ear pain, ringing in the ears, wax buildup, runny nose, nasal congestion, bloody nose, or sore throat. Respiratory: Denies difficulty breathing, shortness of breath, cough or sputum production.   Cardiovascular: Denies chest pain, chest tightness, palpitations or swelling in the hands or feet.  Gastrointestinal: Denies abdominal pain, bloating, constipation, diarrhea or blood in the stool.  GU: Denies urgency, frequency, pain with urination, burning sensation, blood in urine, odor or discharge. Musculoskeletal: Denies decrease in  range of motion, difficulty with gait, muscle pain or joint pain and swelling.  Skin: Pt reports mass of right groin. Denies redness, rashes, lesions or ulcercations.  Neurological: Pt has a history of insomnia. Denies dizziness, difficulty with memory, difficulty with speech or problems with balance and coordination.  Psych: Denies anxiety, depression, SI/HI.  No other specific complaints in a complete review of systems (except as listed in HPI above).     Objective:   Physical Exam  BP (!) 148/80   Pulse 67   Temp 97.9 F (36.6 C)   Resp 17   Wt 144 lb (65.3 kg)   BMI 20.66 kg/m   Wt Readings from Last 3 Encounters:  10/23/19 149 lb (67.6 kg)  08/27/19 147 lb 14.4 oz (67.1 kg)  04/17/19 149 lb 9.6 oz (67.9 kg)    General: Appears his stated age, well developed, well nourished in NAD. Neck:  Neck supple, trachea midline. No masses, lumps or thyromegaly present.  Cardiovascular: Normal rate and rhythm. S1,S2 noted.  Murmur noted.  Pulmonary/Chest: Normal effort and positive vesicular breath sounds. No respiratory distress. No wheezes, rales or ronchi noted.  Abdomen: Soft and nontender.  MNeurological: Alert and oriented.  :  BMET    Component Value Date/Time   NA 140 05/13/2018 0621   K 3.5 05/13/2018 0621   CL 101 05/13/2018 0621   CO2 30 05/13/2018 0621   GLUCOSE 105 (H) 05/13/2018 0621   BUN 1 (A) 02/27/2019 0000   CREATININE 1.2 02/27/2019 0000   CREATININE 1.18 05/13/2018 0621   CALCIUM 8.9 05/13/2018 0621   GFRNONAA 51 (L) 05/13/2018 0621   GFRAA 59 (L) 05/13/2018 0621    Lipid Panel  No results found for: CHOL, TRIG, HDL, CHOLHDL, VLDL, LDLCALC  CBC    Component Value Date/Time   WBC 6.1 05/13/2018 0621   RBC 4.23 (L) 05/13/2018 0621   HGB 12.2 (A) 02/27/2019 0000   HGB 12.2 (A) 02/27/2019 0000   HCT 37 (A) 02/27/2019 0000   HCT 37 (A) 02/27/2019 0000   PLT 375 05/13/2018 0621   MCV 91.9 05/13/2018 0621   MCH 31.9 05/13/2018 0621   MCHC 34.7  05/13/2018 0621   RDW 12.9 05/13/2018 0621   LYMPHSABS 1.6 07/30/2017 0638   MONOABS 0.8 07/30/2017 0638   EOSABS 0.1 07/30/2017 0638   BASOSABS 0.1 07/30/2017 0638    Hgb A1C No results found for: HGBA1C          Assessment & Plan:

## 2020-02-08 NOTE — Assessment & Plan Note (Signed)
Continue HCTZ

## 2020-02-26 ENCOUNTER — Encounter: Payer: Self-pay | Admitting: Internal Medicine

## 2020-03-29 ENCOUNTER — Other Ambulatory Visit: Payer: Self-pay

## 2020-03-29 ENCOUNTER — Ambulatory Visit (INDEPENDENT_AMBULATORY_CARE_PROVIDER_SITE_OTHER): Payer: Medicare Other | Admitting: Dermatology

## 2020-03-29 DIAGNOSIS — L814 Other melanin hyperpigmentation: Secondary | ICD-10-CM

## 2020-03-29 DIAGNOSIS — L820 Inflamed seborrheic keratosis: Secondary | ICD-10-CM

## 2020-03-29 DIAGNOSIS — D1800 Hemangioma unspecified site: Secondary | ICD-10-CM

## 2020-03-29 DIAGNOSIS — Z1283 Encounter for screening for malignant neoplasm of skin: Secondary | ICD-10-CM | POA: Diagnosis not present

## 2020-03-29 DIAGNOSIS — L821 Other seborrheic keratosis: Secondary | ICD-10-CM

## 2020-03-29 DIAGNOSIS — L578 Other skin changes due to chronic exposure to nonionizing radiation: Secondary | ICD-10-CM

## 2020-03-29 DIAGNOSIS — D229 Melanocytic nevi, unspecified: Secondary | ICD-10-CM

## 2020-03-29 DIAGNOSIS — L570 Actinic keratosis: Secondary | ICD-10-CM | POA: Diagnosis not present

## 2020-03-29 NOTE — Progress Notes (Signed)
   Follow-Up Visit   Subjective  Tyler Tapia is a 86 y.o. male who presents for the following: Annual Exam (UBSE - patient has a history of AK's of the face, scalp and ears). The patient presents for Upper Body Skin Exam (UBSE) for skin cancer screening and mole check.  The following portions of the chart were reviewed this encounter and updated as appropriate:  Tobacco  Allergies  Meds  Problems  Med Hx  Surg Hx  Fam Hx     Review of Systems:  No other skin or systemic complaints except as noted in HPI or Assessment and Plan.  Objective  Well appearing patient in no apparent distress; mood and affect are within normal limits.  A focused examination was performed including the upper body . Relevant physical exam findings are noted in the Assessment and Plan.  Objective  chest x 1, upper back x 1 (2): Erythematous keratotic or waxy stuck-on papule or plaque.   Objective  Scalp (11): Erythematous thin papules/macules with gritty scale.   Assessment & Plan    Inflamed seborrheic keratosis (2) chest x 1, upper back x 1  Destruction of lesion - chest x 1, upper back x 1 Complexity: simple   Destruction method: cryotherapy   Informed consent: discussed and consent obtained   Timeout:  patient name, date of birth, surgical site, and procedure verified Lesion destroyed using liquid nitrogen: Yes   Region frozen until ice ball extended beyond lesion: Yes   Outcome: patient tolerated procedure well with no complications   Post-procedure details: wound care instructions given    AK (actinic keratosis) (11) Scalp  Destruction of lesion - Scalp Complexity: simple   Destruction method: cryotherapy   Informed consent: discussed and consent obtained   Timeout:  patient name, date of birth, surgical site, and procedure verified Lesion destroyed using liquid nitrogen: Yes   Region frozen until ice ball extended beyond lesion: Yes   Outcome: patient tolerated procedure well  with no complications   Post-procedure details: wound care instructions given    Skin cancer screening   Lentigines - Scattered tan macules - Discussed due to sun exposure - Benign, observe - Call for any changes  Seborrheic Keratoses - Stuck-on, waxy, tan-brown papules and plaques  - Discussed benign etiology and prognosis. - Observe - Call for any changes  Melanocytic Nevi - Tan-brown and/or pink-flesh-colored symmetric macules and papules - Benign appearing on exam today - Observation - Call clinic for new or changing moles - Recommend daily use of broad spectrum spf 30+ sunscreen to sun-exposed areas.   Hemangiomas - Red papules - Discussed benign nature - Observe - Call for any changes  Actinic Damage - diffuse scaly erythematous macules with underlying dyspigmentation - Recommend daily broad spectrum sunscreen SPF 30+ to sun-exposed areas, reapply every 2 hours as needed.  - Call for new or changing lesions.  Skin cancer screening performed today.   Return in about 8 months (around 11/29/2020).  Luther Redo, CMA, am acting as scribe for Sarina Ser, MD .  Documentation: I have reviewed the above documentation for accuracy and completeness, and I agree with the above.  Sarina Ser, MD

## 2020-03-30 ENCOUNTER — Encounter: Payer: Self-pay | Admitting: Dermatology

## 2020-04-15 ENCOUNTER — Encounter: Payer: Self-pay | Admitting: Internal Medicine

## 2020-04-15 ENCOUNTER — Other Ambulatory Visit: Payer: Self-pay

## 2020-04-15 ENCOUNTER — Ambulatory Visit: Payer: Medicare Other | Admitting: Internal Medicine

## 2020-04-15 VITALS — BP 142/79 | HR 60 | Temp 98.0°F | Resp 16 | Wt 149.0 lb

## 2020-04-15 DIAGNOSIS — N1831 Chronic kidney disease, stage 3a: Secondary | ICD-10-CM | POA: Diagnosis not present

## 2020-04-15 DIAGNOSIS — K5909 Other constipation: Secondary | ICD-10-CM | POA: Diagnosis not present

## 2020-04-15 DIAGNOSIS — G479 Sleep disorder, unspecified: Secondary | ICD-10-CM | POA: Diagnosis not present

## 2020-04-15 DIAGNOSIS — I10 Essential (primary) hypertension: Secondary | ICD-10-CM | POA: Diagnosis not present

## 2020-04-15 DIAGNOSIS — I358 Other nonrheumatic aortic valve disorders: Secondary | ICD-10-CM

## 2020-04-15 NOTE — Assessment & Plan Note (Signed)
BP Readings from Last 3 Encounters:  04/15/20 (!) 142/79  02/08/20 (!) 148/80  10/23/19 (!) 155/86   Good control on HCTZ

## 2020-04-15 NOTE — Assessment & Plan Note (Signed)
Does okay with the miralax and colace

## 2020-04-15 NOTE — Assessment & Plan Note (Signed)
Does okay with tea, valerian root and low dose trazodone

## 2020-04-15 NOTE — Assessment & Plan Note (Signed)
Last GFR 47 No action needed in healthy 85 year old

## 2020-04-15 NOTE — Assessment & Plan Note (Signed)
?  aortic sclerosis No symptoms of sig aortic stenosis No action indicated

## 2020-04-15 NOTE — Progress Notes (Signed)
Subjective:    Patient ID: Tyler Tapia, male    DOB: [birth year], 86 y.o.   MRN: [id number]  HPI Visit in assisted living apartment for review of chronic medical conditions Reviewed status with Luellen Pucker RN  He is doing well Still walks outside daily--other exercise Remains independent with walking ---no assistive device Independent with ADLs  Mild memory issues No functional decline  Continues on chamamille tea, valerian and trazodone Sleeps okay with that  Recent labs show GFR of 47  No chest pain No palpitations No dizziness or sycnope No edema  Current Outpatient Medications on File Prior to Visit  Medication Sig Dispense Refill  . docusate sodium (COLACE) 100 MG capsule Take 200 mg by mouth daily.     . hydrochlorothiazide (MICROZIDE) 12.5 MG capsule Take 1 capsule (12.5 mg total) by mouth daily. 90 capsule 3  . Melatonin 3 MG TABS Take 1 tablet by mouth at bedtime.    . Multiple Vitamin (MULTI-VITAMINS) TABS Take 1 tablet by mouth daily.    . polyethylene glycol (MIRALAX / GLYCOLAX) packet Take 8.5 g by mouth daily.    . traZODone (DESYREL) 50 MG tablet 50 mg at bedtime.    Cristino Martes Root 450 MG CAPS Take 3 capsules by mouth at bedtime.     No current facility-administered medications on file prior to visit.    No Known Allergies  Past Medical History:  Diagnosis Date  . Chronic constipation   . Dysplastic nevus 06/02/2010   left distal dorsal foot  . Dysplastic nevus 01/07/2018   left upper back lat to midline  . Hyperlipidemia   . Hypertension   . Kidney stone   . Melanoma in situ ([place]) 01/07/2018   right base of neck spinal top of shoulder  . Sleep disturbance     Past Surgical History:  Procedure Laterality Date  . CATARACT EXTRACTION W/ INTRAOCULAR LENS IMPLANT Bilateral   . HEMORRHOID SURGERY    . KIDNEY STONE SURGERY      Family History  Problem Relation Age of Onset  . Tuberculosis Mother   . COPD Father        from mustard gas in [facility]   . Kidney cancer Neg Hx   . Kidney disease Neg Hx   . Prostate cancer Neg Hx   . Diabetes Neg Hx   . Heart disease Neg Hx     Social History   Socioeconomic History  . Marital status: Widowed    Spouse name: Not on file  . Number of children: 2  . Years of education: Not on file  . Highest education level: Not on file  Occupational History  . 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 Smoking status: Former Smoker    Quit date: 10/10/1963    Years since quitting: 56.5  . Smokeless tobacco: Never Used  Substance and Sexual Activity  . Alcohol use: Yes    Comment: occasional  . Drug use: Not on file  . Sexual activity: Not on file  Other Topics Concern  . Not on file  Social History Narrative   Wife died in Jan 27, 2003   2 daughters      Has living will   Daughters would be health care POA   Requests DNR---done 10/10/18   No tube feeds   Social Determinants of Health   Financial Resource Strain:   . Difficulty of Paying Living Expenses:   Food Insecurity:   .  Worried About Charity fundraiser in the Last Year:   . Arboriculturist in the Last Year:   Transportation Needs:   . Film/video editor (Medical):   Marland Kitchen Lack of Transportation (Non-Medical):   Physical Activity:   . Days of Exercise per Week:   . Minutes of Exercise per Session:   Stress:   . Feeling of Stress :   Social Connections:   . Frequency of Communication with Friends and Family:   . Frequency of Social Gatherings with Friends and Family:   . Attends Religious Services:   . Active Member of Clubs or Organizations:   . Attends Archivist Meetings:   Marland Kitchen Marital Status:   Intimate Partner Violence:   . Fear of Current or Ex-Partner:   . Emotionally Abused:   Marland Kitchen Physically Abused:   . Sexually Abused:    Review of Systems Some constipation--continues on stool softener and miralax No  Appetite is not great---"light  eater" Weight has been stable No sig back or joint pains    Objective:   Physical Exam Constitutional:      General: He is not in acute distress.    Appearance: Normal appearance. He is normal weight.  HENT:     Head: Normocephalic and atraumatic.  Cardiovascular:     Rate and Rhythm: Normal rate and regular rhythm.     Heart sounds: No gallop.      Comments: Soft coarse systolic murmur Pulmonary:     Effort: Pulmonary effort is normal.     Breath sounds: Normal breath sounds. No wheezing or rales.  Abdominal:     Palpations: Abdomen is soft.     Tenderness: There is no abdominal tenderness.  Musculoskeletal:     Cervical back: Neck supple.     Right lower leg: No edema.     Left lower leg: No edema.  Lymphadenopathy:     Cervical: No cervical adenopathy.  Skin:    Comments: No lesions  Neurological:     General: No focal deficit present.  Psychiatric:        Mood and Affect: Mood normal.        Behavior: Behavior normal.            Assessment & Plan:

## 2020-05-10 ENCOUNTER — Ambulatory Visit: Payer: Medicare Other | Admitting: Dermatology

## 2020-08-04 ENCOUNTER — Ambulatory Visit: Payer: Medicare Other | Admitting: Internal Medicine

## 2020-08-04 ENCOUNTER — Encounter: Payer: Self-pay | Admitting: Internal Medicine

## 2020-08-04 ENCOUNTER — Other Ambulatory Visit: Payer: Self-pay

## 2020-08-04 DIAGNOSIS — G479 Sleep disorder, unspecified: Secondary | ICD-10-CM | POA: Diagnosis not present

## 2020-08-04 DIAGNOSIS — N1831 Chronic kidney disease, stage 3a: Secondary | ICD-10-CM | POA: Diagnosis not present

## 2020-08-04 DIAGNOSIS — I10 Essential (primary) hypertension: Secondary | ICD-10-CM | POA: Diagnosis not present

## 2020-08-04 DIAGNOSIS — K5909 Other constipation: Secondary | ICD-10-CM | POA: Diagnosis not present

## 2020-08-04 NOTE — Assessment & Plan Note (Signed)
Continue HCTZ Will check BMET yearly

## 2020-08-04 NOTE — Assessment & Plan Note (Signed)
Continue Mirilax and Colace

## 2020-08-04 NOTE — Assessment & Plan Note (Signed)
No ACEI/ARB Monitor BMET yearly

## 2020-08-04 NOTE — Progress Notes (Signed)
Subjective:    Patient ID: Tyler Tapia, male    DOB: Feb 01, 1921, 86 y.o.   MRN: [id number]  HPI  Resident seen in [address] for routine followup No new concerns from staff or resident He sleeps well with the use of Trazadone and Melatonin. Also drinks Chamomile Tea before bed. Independent with ADL's. Walks without device. Appetite good, weight stable. Bowels okay with Mirilax and Colace. He denies urinary incontinence. He reports no issues with his mood.  HTN: His BP today is 134/69. He is taking HCTZ as prescribed.  Sleep Disturbance: Managed with Trazadone and Melatonin.  Chronic Constipation: Managed on Mirilax and Colace.   CKD 3: Last creatinine 1.27, GFR 47, 02/2020. Not on an ACEI/ARB. Review of Systems      Past Medical History:  Diagnosis Date  . Chronic constipation   . Dysplastic nevus 06/02/2010   left distal dorsal foot  . Dysplastic nevus 01/07/2018   left upper back lat to midline  . Hyperlipidemia   . Hypertension   . Kidney stone   . Melanoma in situ ([place]) 01/07/2018   right base of neck spinal top of shoulder  . Sleep disturbance     Current Outpatient Medications  Medication Sig Dispense Refill  . docusate sodium (COLACE) 100 MG capsule Take 200 mg by mouth daily.     . hydrochlorothiazide (MICROZIDE) 12.5 MG capsule Take 1 capsule (12.5 mg total) by mouth daily. 90 capsule 3  . Melatonin 3 MG TABS Take 1 tablet by mouth at bedtime.    . Multiple Vitamin (MULTI-VITAMINS) TABS Take 1 tablet by mouth daily.    . polyethylene glycol (MIRALAX / GLYCOLAX) packet Take 8.5 g by mouth daily.    . traZODone (DESYREL) 50 MG tablet 50 mg at bedtime.    Cristino Martes Root 450 MG CAPS Take 3 capsules by mouth at bedtime.     No current facility-administered medications for this visit.    No Known Allergies  Family History  Problem Relation Age of Onset  . Tuberculosis Mother   . COPD Father        from mustard gas in [facility]  . Kidney cancer Neg Hx   . Kidney  disease Neg Hx   . Prostate cancer Neg Hx   . Diabetes Neg Hx   . Heart disease Neg Hx     Social History   Socioeconomic History  . Marital status: Widowed    Spouse name: Not on file  . Number of children: 2  . Years of education: Not on file  . Highest education level: Not on file  Occupational History  . 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 Smoking status: Former Smoker    Quit date: 10/10/1963    Years since quitting: 56.8  . Smokeless tobacco: Never Used  Substance and Sexual Activity  . Alcohol use: Yes    Comment: occasional  . Drug use: Not on file  . Sexual activity: Not on file  Other Topics Concern  . Not on file  Social History Narrative   Wife died in 2003/01/03   2 daughters      Has living will   Daughters would be health care POA   Requests DNR---done 10/10/18   No tube feeds   Social Determinants of Health   Financial Resource Strain:   . Difficulty of Paying Living Expenses: Not on file  Food Insecurity:   .  Worried About Charity fundraiser in the Last Year: Not on file  . Ran Out of Food in the Last Year: Not on file  Transportation Needs:   . Lack of Transportation (Medical): Not on file  . Lack of Transportation (Non-Medical): Not on file  Physical Activity:   . Days of Exercise per Week: Not on file  . Minutes of Exercise per Session: Not on file  Stress:   . Feeling of Stress : Not on file  Social Connections:   . Frequency of Communication with Friends and Family: Not on file  . Frequency of Social Gatherings with Friends and Family: Not on file  . Attends Religious Services: Not on file  . Active Member of Clubs or Organizations: Not on file  . Attends Archivist Meetings: Not on file  . Marital Status: Not on file  Intimate Partner Violence:   . Fear of Current or Ex-Partner: Not on file  . Emotionally Abused: Not on file  . Physically Abused: Not on  file  . Sexually Abused: Not on file     Constitutional: Denies fever, malaise, fatigue, headache or abrupt weight changes.  HEENT: Denies eye pain, eye redness, ear pain, ringing in the ears, wax buildup, runny nose, nasal congestion, bloody nose, or sore throat. Respiratory: Denies difficulty breathing, shortness of breath, cough or sputum production.   Cardiovascular: Denies chest pain, chest tightness, palpitations or swelling in the hands or feet.  Gastrointestinal: Pt has a history of constipation. Denies abdominal pain, bloating, diarrhea or blood in the stool.  GU: Denies urgency, frequency, pain with urination, burning sensation, blood in urine, odor or discharge. Musculoskeletal: Denies decrease in range of motion, difficulty with gait, muscle pain or joint pain and swelling.  Skin: Denies redness, rashes, lesions or ulcercations.  Neurological: Pt has a history of sleep disturbance. Denies dizziness, difficulty with memory, difficulty with speech or problems with balance and coordination.  Psych: Denies anxiety, depression, SI/HI.  No other specific complaints in a complete review of systems (except as listed in HPI above).  Objective:   Physical Exam   BP 134/69   Pulse 68   Temp 98.3 F (36.8 C)   Resp 18   Wt 148 lb (67.1 kg)   SpO2 98%   BMI 21.24 kg/m  Wt Readings from Last 3 Encounters:  08/04/20 148 lb (67.1 kg)  04/15/20 149 lb (67.6 kg)  02/08/20 144 lb (65.3 kg)    General: Appears his stated age, well developed, well nourished in NAD. HEENT: Head: normal shape and size; Eyes: sclera white, no icterus,  EOMs intact; Ears: Tm's gray and intact, normal light reflex; Nose: mucosa pink and mois Cardiovascular: Normal rate and rhythm. S1,S2 noted.  Murmur noted. No JVD or BLE edema.  Pulmonary/Chest: Normal effort and positive vesicular breath sounds. No respiratory distress. No wheezes, rales or ronchi noted.  Abdomen: Soft and nontender. Normal bowel  sounds. Musculoskeletal: No difficulty with gait.  Neurological: Alert and oriented.  Psychiatric: Mood and affect normal.   BMET    Component Value Date/Time   NA 140 05/13/2018 0621   K 3.5 05/13/2018 0621   CL 101 05/13/2018 0621   CO2 30 05/13/2018 0621   GLUCOSE 105 (H) 05/13/2018 0621   BUN 1 (A) 02/27/2019 0000   CREATININE 1.2 02/27/2019 0000   CREATININE 1.18 05/13/2018 0621   CALCIUM 8.9 05/13/2018 0621   GFRNONAA 51 (L) 05/13/2018 0621   GFRAA 59 (L) 05/13/2018 5643  Lipid Panel  No results found for: CHOL, TRIG, HDL, CHOLHDL, VLDL, LDLCALC  CBC    Component Value Date/Time   WBC 6.1 05/13/2018 0621   RBC 4.23 (L) 05/13/2018 0621   HGB 12.2 (A) 02/27/2019 0000   HGB 12.2 (A) 02/27/2019 0000   HCT 37 (A) 02/27/2019 0000   HCT 37 (A) 02/27/2019 0000   PLT 375 05/13/2018 0621   MCV 91.9 05/13/2018 0621   MCH 31.9 05/13/2018 0621   MCHC 34.7 05/13/2018 0621   RDW 12.9 05/13/2018 0621   LYMPHSABS 1.6 07/30/2017 0638   MONOABS 0.8 07/30/2017 0638   EOSABS 0.1 07/30/2017 0638   BASOSABS 0.1 07/30/2017 0638    Hgb A1C No results found for: HGBA1C         Assessment & Plan:   Webb Silversmith, NP This visit occurred during the SARS-CoV-2 public health emergency.  Safety protocols were in place, including screening questions prior to the visit, additional usage of staff PPE, and extensive cleaning of exam room while observing appropriate contact time as indicated for disinfecting solutions.

## 2020-08-04 NOTE — Assessment & Plan Note (Signed)
Continue Trazadone and Melatonin

## 2020-10-21 ENCOUNTER — Encounter: Payer: Self-pay | Admitting: Internal Medicine

## 2020-10-21 ENCOUNTER — Ambulatory Visit: Payer: Medicare Other | Admitting: Internal Medicine

## 2020-10-21 ENCOUNTER — Other Ambulatory Visit: Payer: Self-pay

## 2020-10-21 DIAGNOSIS — I10 Essential (primary) hypertension: Secondary | ICD-10-CM | POA: Diagnosis not present

## 2020-10-21 DIAGNOSIS — G479 Sleep disorder, unspecified: Secondary | ICD-10-CM | POA: Diagnosis not present

## 2020-10-21 DIAGNOSIS — K5909 Other constipation: Secondary | ICD-10-CM

## 2020-10-21 DIAGNOSIS — N1831 Chronic kidney disease, stage 3a: Secondary | ICD-10-CM | POA: Diagnosis not present

## 2020-10-21 NOTE — Assessment & Plan Note (Signed)
BP Readings from Last 3 Encounters:  10/21/20 138/76  08/04/20 134/69  04/15/20 (!) 142/79   Good control on HCTZ

## 2020-10-21 NOTE — Progress Notes (Signed)
Subjective:    Patient ID: Tyler Tapia, male    DOB: [birth year], 87 y.o.   MRN: [id number]  HPI  Visit in [place] apartment for review of chronic health conditions Reviewed status with Luellen Pucker RN--no new concerns  He is satisfied Remains independent with ADLs He handles his medications  Bowels okay with miralax and colace No blood  Sleeps well with chamamille tea, valerian root and low dose trazodone Also use melatonin No anxiety or depression  No chest pain  No SOB Does walk around a mile a day--20 minutes Does other exercise No dizziness or syncope No edema  Last GFR 47 Will have labs again later in spring  Current Outpatient Medications on File Prior to Visit  Medication Sig Dispense Refill  . docusate sodium (COLACE) 100 MG capsule Take 200 mg by mouth daily.     . hydrochlorothiazide (MICROZIDE) 12.5 MG capsule Take 1 capsule (12.5 mg total) by mouth daily. 90 capsule 3  . Melatonin 3 MG TABS Take 1 tablet by mouth at bedtime.    . Multiple Vitamin (MULTI-VITAMINS) TABS Take 1 tablet by mouth daily.    . polyethylene glycol (MIRALAX / GLYCOLAX) packet Take 8.5 g by mouth daily.    . traZODone (DESYREL) 50 MG tablet 50 mg at bedtime.    Cristino Martes Root 450 MG CAPS Take 3 capsules by mouth at bedtime.     No current facility-administered medications on file prior to visit.    No Known Allergies  Past Medical History:  Diagnosis Date  . Chronic constipation   . Dysplastic nevus 06/02/2010   left distal dorsal foot  . Dysplastic nevus 01/07/2018   left upper back lat to midline  . Hyperlipidemia   . Hypertension   . Kidney stone   . Melanoma in situ ([place]) 01/07/2018   right base of neck spinal top of shoulder  . Sleep disturbance     Past Surgical History:  Procedure Laterality Date  . CATARACT EXTRACTION W/ INTRAOCULAR LENS IMPLANT Bilateral   . HEMORRHOID SURGERY    . KIDNEY STONE SURGERY      Family History  Problem Relation Age of Onset  .  Tuberculosis Mother   . COPD Father        from mustard gas in [facility]  . Kidney cancer Neg Hx   . Kidney disease Neg Hx   . Prostate cancer Neg Hx   . Diabetes Neg Hx   . Heart disease Neg Hx     Social History   Socioeconomic History  . Marital status: Widowed    Spouse name: Not on file  . Number of children: 2  . Years of education: Not on file  . Highest education level: Not on file  Occupational History  . 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 Smoking status: Former Smoker    Quit date: 10/10/1963    Years since quitting: 57.0  . Smokeless tobacco: Never Used  Substance and Sexual Activity  . Alcohol use: Yes    Comment: occasional  . Drug use: Not on file  . Sexual activity: Not on file  Other Topics Concern  . Not on file  Social History Narrative   Wife died in 01-26-03   2 daughters      Has living will   Daughters would be health care POA   Requests DNR---done 10/10/18   No tube feeds   Social Determinants  of Health   Financial Resource Strain: Not on file  Food Insecurity: Not on file  Transportation Needs: Not on file  Physical Activity: Not on file  Stress: Not on file  Social Connections: Not on file  Intimate Partner Violence: Not on file   Review of Systems Appetite is okay---"not a big eater" Monitors weight--which is holding Voids okay. Occasional nocturia--not regular No sig back or joint pains Mild memory issues---no progression (mostly names) Some leg itching--uses cream for this with relief    Objective:   Physical Exam Cardiovascular:     Rate and Rhythm: Normal rate and regular rhythm.     Heart sounds: No gallop.      Comments: Gr 2/6 coarse aortic systolic murmur Pulmonary:     Effort: Pulmonary effort is normal.     Breath sounds: Normal breath sounds. No wheezing or rales.  Abdominal:     Palpations: Abdomen is soft.     Tenderness: There is no abdominal  tenderness.  Musculoskeletal:     Cervical back: Neck supple.     Right lower leg: No edema.     Left lower leg: No edema.  Lymphadenopathy:     Cervical: No cervical adenopathy.  Psychiatric:        Mood and Affect: Mood normal.        Behavior: Behavior normal.            Assessment & Plan:

## 2020-10-21 NOTE — Assessment & Plan Note (Signed)
BP control fine Would not change meds at his age

## 2020-10-21 NOTE — Assessment & Plan Note (Signed)
Does well with tea, valerian, trazodone and melatonin

## 2020-10-21 NOTE — Assessment & Plan Note (Signed)
Does okay with colace and miralax

## 2020-11-25 ENCOUNTER — Other Ambulatory Visit: Payer: Self-pay

## 2020-11-25 ENCOUNTER — Ambulatory Visit (INDEPENDENT_AMBULATORY_CARE_PROVIDER_SITE_OTHER): Payer: Medicare Other | Admitting: Dermatology

## 2020-11-25 DIAGNOSIS — Z872 Personal history of diseases of the skin and subcutaneous tissue: Secondary | ICD-10-CM

## 2020-11-25 DIAGNOSIS — L814 Other melanin hyperpigmentation: Secondary | ICD-10-CM

## 2020-11-25 DIAGNOSIS — L578 Other skin changes due to chronic exposure to nonionizing radiation: Secondary | ICD-10-CM | POA: Diagnosis not present

## 2020-11-25 DIAGNOSIS — Z1283 Encounter for screening for malignant neoplasm of skin: Secondary | ICD-10-CM | POA: Diagnosis not present

## 2020-11-25 DIAGNOSIS — D692 Other nonthrombocytopenic purpura: Secondary | ICD-10-CM | POA: Diagnosis not present

## 2020-11-25 DIAGNOSIS — L820 Inflamed seborrheic keratosis: Secondary | ICD-10-CM

## 2020-11-25 DIAGNOSIS — D1800 Hemangioma unspecified site: Secondary | ICD-10-CM

## 2020-11-25 DIAGNOSIS — D229 Melanocytic nevi, unspecified: Secondary | ICD-10-CM

## 2020-11-25 DIAGNOSIS — L570 Actinic keratosis: Secondary | ICD-10-CM

## 2020-11-25 NOTE — Patient Instructions (Signed)

## 2020-11-25 NOTE — Progress Notes (Signed)
Follow-Up Visit   Subjective  Tyler Tapia is a 87 y.o. male who presents for the following: Annual Exam (History of Aks - TBSE today). The patient presents for Total-Body Skin Exam (TBSE) for skin cancer screening and mole check.  The following portions of the chart were reviewed this encounter and updated as appropriate:   Tobacco  Allergies  Meds  Problems  Med Hx  Surg Hx  Fam Hx     Review of Systems:  No other skin or systemic complaints except as noted in HPI or Assessment and Plan.  Objective  Well appearing patient in no apparent distress; mood and affect are within normal limits.  A full examination was performed including scalp, head, eyes, ears, nose, lips, neck, chest, axillae, abdomen, back, buttocks, bilateral upper extremities, bilateral lower extremities, hands, feet, fingers, toes, fingernails, and toenails. All findings within normal limits unless otherwise noted below.  Objective  Scalp x 1, face x 4 (5): Erythematous thin papules/macules with gritty scale.   Objective  Scalp (2): Erythematous keratotic or waxy stuck-on papule or plaque.    Assessment & Plan    Purpura - Chronic; persistent and recurrent.  Treatable, but not curable. - Violaceous macules and patches - Benign - Related to trauma, age, sun damage and/or use of blood thinners, chronic use of topical and/or oral steroids - Observe - Can use OTC arnica containing moisturizer such as Dermend Bruise Formula if desired - Call for worsening or other concerns  Lentigines - Scattered tan macules - Discussed due to sun exposure - Benign, observe - Call for any changes  Seborrheic Keratoses - Stuck-on, waxy, tan-brown papules and plaques  - Discussed benign etiology and prognosis. - Observe - Call for any changes  Melanocytic Nevi - Tan-brown and/or pink-flesh-colored symmetric macules and papules - Benign appearing on exam today - Observation - Call clinic for new or changing  moles - Recommend daily use of broad spectrum spf 30+ sunscreen to sun-exposed areas.   Hemangiomas - Red papules - Discussed benign nature - Observe - Call for any changes  Actinic Damage - Chronic, secondary to cumulative UV/sun exposure - diffuse scaly erythematous macules with underlying dyspigmentation - Recommend daily broad spectrum sunscreen SPF 30+ to sun-exposed areas, reapply every 2 hours as needed.  - Call for new or changing lesions.  Skin cancer screening performed today.  AK (actinic keratosis) (5) Scalp x 1, face x 4  Destruction of lesion - Scalp x 1, face x 4 Complexity: simple   Destruction method: cryotherapy   Informed consent: discussed and consent obtained   Timeout:  patient name, date of birth, surgical site, and procedure verified Lesion destroyed using liquid nitrogen: Yes   Region frozen until ice ball extended beyond lesion: Yes   Outcome: patient tolerated procedure well with no complications   Post-procedure details: wound care instructions given    Inflamed seborrheic keratosis (2) Scalp  Destruction of lesion - Scalp Complexity: simple   Destruction method: cryotherapy   Informed consent: discussed and consent obtained   Timeout:  patient name, date of birth, surgical site, and procedure verified Lesion destroyed using liquid nitrogen: Yes   Region frozen until ice ball extended beyond lesion: Yes   Outcome: patient tolerated procedure well with no complications   Post-procedure details: wound care instructions given    Skin cancer screening  Return in about 6 months (around 05/25/2021) for sun exposed areas.   I, Ashok Cordia, CMA, am acting as Education administrator for [employer]  Nehemiah Massed, MD .  Documentation: I have reviewed the above documentation for accuracy and completeness, and I agree with the above.  Sarina Ser, MD

## 2020-11-28 ENCOUNTER — Encounter: Payer: Self-pay | Admitting: Dermatology

## 2021-02-02 ENCOUNTER — Ambulatory Visit: Payer: Medicare Other | Admitting: Internal Medicine

## 2021-02-02 DIAGNOSIS — I10 Essential (primary) hypertension: Secondary | ICD-10-CM | POA: Diagnosis not present

## 2021-02-02 DIAGNOSIS — N1831 Chronic kidney disease, stage 3a: Secondary | ICD-10-CM | POA: Diagnosis not present

## 2021-02-02 DIAGNOSIS — G479 Sleep disorder, unspecified: Secondary | ICD-10-CM | POA: Diagnosis not present

## 2021-02-02 DIAGNOSIS — K5909 Other constipation: Secondary | ICD-10-CM | POA: Diagnosis not present

## 2021-02-03 ENCOUNTER — Encounter: Payer: Self-pay | Admitting: Internal Medicine

## 2021-02-03 NOTE — Assessment & Plan Note (Signed)
Continue Colace and Miralax

## 2021-02-03 NOTE — Assessment & Plan Note (Signed)
BMET yearly

## 2021-02-03 NOTE — Assessment & Plan Note (Signed)
Continue Melatonin and Trazadone for now

## 2021-02-03 NOTE — Assessment & Plan Note (Signed)
Controlled on HCTZ Monitor

## 2021-02-03 NOTE — Progress Notes (Signed)
Subjective:    Patient ID: Tyler Tapia, male    DOB: 1921-01-31, 87 y.o.   MRN: [id number]  HPI  Resident seen in [address]. No new concerns from staff. Resident reports skin tear to left elbow from bumping into the door. He sleeps well with use of Chamomile Tea, Melatonin and Trazadone. He walks without a device. He is independent with ADL's. He reports his appetite is fair but his weight has been stable. His bowels move good with the use of Colace and Miralax. He reports some urinary frequency but denies nocturia or incontinence. He denies pain, reflux or SOB. He feels like his mood is okay.   HTN: Controlled on HCTZ.  Chronic Constipation: Controlled with Colace and Miralax.  Sleep Disturbance: Managed with Trazadone, Melatonin and Chamomile Tea.  CKD3: His last creat was 1.27, GFR 47, 02/2020. He is not currently on an ACEI/ARB.  Review of Systems      Past Medical History:  Diagnosis Date  . Chronic constipation   . Dysplastic nevus 06/02/2010   left distal dorsal foot  . Dysplastic nevus 01/07/2018   left upper back lat to midline  . Hyperlipidemia   . Hypertension   . Kidney stone   . Melanoma in situ ([place]) 01/07/2018   right base of neck spinal top of shoulder  . Sleep disturbance     Current Outpatient Medications  Medication Sig Dispense Refill  . docusate sodium (COLACE) 100 MG capsule Take 200 mg by mouth daily.     . hydrochlorothiazide (MICROZIDE) 12.5 MG capsule Take 1 capsule (12.5 mg total) by mouth daily. 90 capsule 3  . Melatonin 3 MG TABS Take 1 tablet by mouth at bedtime.    . Multiple Vitamin (MULTI-VITAMINS) TABS Take 1 tablet by mouth daily.    . polyethylene glycol (MIRALAX / GLYCOLAX) packet Take 8.5 g by mouth daily.    . traZODone (DESYREL) 50 MG tablet 50 mg at bedtime.    Cristino Martes Root 450 MG CAPS Take 3 capsules by mouth at bedtime. (Patient not taking: Reported on 02/03/2021)     No current facility-administered medications for this  visit.    No Known Allergies  Family History  Problem Relation Age of Onset  . Tuberculosis Mother   . COPD Father        from mustard gas in [facility]  . Kidney cancer Neg Hx   . Kidney disease Neg Hx   . Prostate cancer Neg Hx   . Diabetes Neg Hx   . Heart disease Neg Hx     Social History   Socioeconomic History  . Marital status: Widowed    Spouse name: Not on file  . Number of children: 2  . Years of education: Not on file  . Highest education level: Not on file  Occupational History  . 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 Smoking status: Former Smoker    Quit date: 10/10/1963    Years since quitting: 57.3  . Smokeless tobacco: Never Used  Substance and Sexual Activity  . Alcohol use: Yes    Comment: occasional  . Drug use: Not on file  . Sexual activity: Not on file  Other Topics Concern  . Not on file  Social History Narrative   Wife died in 01-15-2003   2 daughters      Has living will   Daughters would be health care POA  Requests DNR---done 10/10/18   No tube feeds   Social Determinants of Health   Financial Resource Strain: Not on file  Food Insecurity: Not on file  Transportation Needs: Not on file  Physical Activity: Not on file  Stress: Not on file  Social Connections: Not on file  Intimate Partner Violence: Not on file     Constitutional: Denies fever, malaise, fatigue, headache or abrupt weight changes.  HEENT: Denies eye pain, eye redness, ear pain, ringing in the ears, wax buildup, runny nose, nasal congestion, bloody nose, or sore throat. Respiratory: Denies difficulty breathing, shortness of breath, cough or sputum production.   Cardiovascular: Denies chest pain, chest tightness, palpitations or swelling in the hands or feet.  Gastrointestinal: Denies abdominal pain, bloating, constipation, diarrhea or blood in the stool.  GU: Pt reports urinary frequency. Denies  urgency, pain with urination, burning sensation, blood in urine, odor or discharge. Musculoskeletal: Denies decrease in range of motion, difficulty with gait, muscle pain or joint pain and swelling.  Skin: Pt reports skin tear to left elbow. Denies redness, rashes, lesions or ulcercations.  Neurological: Pt reports sleep disturbance. Denies dizziness, difficulty with memory, difficulty with speech or problems with balance and coordination.  Psych: Denies anxiety, depression, SI/HI.  No other specific complaints in a complete review of systems (except as listed in HPI above).  Objective:   Physical Exam    BP 134/78   Pulse (!) 53   Temp 98.1 F (36.7 C)   Resp 18   Wt 150 lb 9.6 oz (68.3 kg)   BMI 21.61 kg/m  Wt Readings from Last 3 Encounters:  02/03/21 150 lb 9.6 oz (68.3 kg)  10/21/20 148 lb 6.4 oz (67.3 kg)  08/04/20 148 lb (67.1 kg)    General: Appears his stated age, well developed, well nourished in NAD. Skin: Warm, dry and intact. Skin tear with surrounding bruising noted to left elbow.  HEENT: Head: normal shape and size; Eyes: sclera white and EOMs intact;  Neck:  Neck supple, trachea midline. No masses, lumps or thyromegaly present.  Cardiovascular: Bradycardic with normal rhythm. S1,S2 noted. Murmur noted. No JVD or BLE edema.  Pulmonary/Chest: Normal effort and positive vesicular breath sounds. No respiratory distress. No wheezes, rales or ronchi noted.  Abdomen: Soft and nontender. Normal bowel sounds.  Musculoskeletal: No difficulty with gait.  Neurological: Alert and oriented.  Psychiatric: Mood and affect normal.   BMET    Component Value Date/Time   NA 140 05/13/2018 0621   K 3.5 05/13/2018 0621   CL 101 05/13/2018 0621   CO2 30 05/13/2018 0621   GLUCOSE 105 (H) 05/13/2018 0621   BUN 1 (A) 02/27/2019 0000   CREATININE 1.2 02/27/2019 0000   CREATININE 1.18 05/13/2018 0621   CALCIUM 8.9 05/13/2018 0621   GFRNONAA 51 (L) 05/13/2018 0621   GFRAA 59 (L)  05/13/2018 0621    Lipid Panel  No results found for: CHOL, TRIG, HDL, CHOLHDL, VLDL, LDLCALC  CBC    Component Value Date/Time   WBC 6.1 05/13/2018 0621   RBC 4.23 (L) 05/13/2018 0621   HGB 12.2 (A) 02/27/2019 0000   HGB 12.2 (A) 02/27/2019 0000   HCT 37 (A) 02/27/2019 0000   HCT 37 (A) 02/27/2019 0000   PLT 375 05/13/2018 0621   MCV 91.9 05/13/2018 0621   MCH 31.9 05/13/2018 0621   MCHC 34.7 05/13/2018 0621   RDW 12.9 05/13/2018 0621   LYMPHSABS 1.6 07/30/2017 0638   MONOABS 0.8 07/30/2017 9562  EOSABS 0.1 07/30/2017 0638   BASOSABS 0.1 07/30/2017 0638    Hgb A1C No results found for: HGBA1C        Assessment & Plan:

## 2021-05-12 ENCOUNTER — Ambulatory Visit: Payer: Medicare Other | Admitting: Internal Medicine

## 2021-05-12 ENCOUNTER — Encounter: Payer: Self-pay | Admitting: Internal Medicine

## 2021-05-12 DIAGNOSIS — I10 Essential (primary) hypertension: Secondary | ICD-10-CM

## 2021-05-12 DIAGNOSIS — G479 Sleep disorder, unspecified: Secondary | ICD-10-CM | POA: Diagnosis not present

## 2021-05-12 DIAGNOSIS — N1831 Chronic kidney disease, stage 3a: Secondary | ICD-10-CM

## 2021-05-12 DIAGNOSIS — K5909 Other constipation: Secondary | ICD-10-CM | POA: Diagnosis not present

## 2021-05-12 NOTE — Assessment & Plan Note (Signed)
Does well with his regimen including melatonin and trazodone. No evidence of side effects

## 2021-05-12 NOTE — Progress Notes (Signed)
Subjective:    Patient ID: Tyler Tapia, male    DOB: [birth year], 87 y.o.   MRN: [id number]  HPI Visit in [place] apartment for review of chronic medical conditions Reviewed status with Luellen Pucker RN  Continues to do well Remains independent with ADLs Walks a mile daily---20 minute pace  Sleeps okay Takes valerian root, trazodone, chamamille tea, melatonin No AM sedation  Last GFR stable at 47 Voids okay---but some increased frequency Usually no nocturia  No chest pain No SOB No dizziness or syncope No edema  Bowels move okay with miralax/colace  Current Outpatient Medications on File Prior to Visit  Medication Sig Dispense Refill   docusate sodium (COLACE) 100 MG capsule Take 200 mg by mouth daily.      hydrochlorothiazide (MICROZIDE) 12.5 MG capsule Take 1 capsule (12.5 mg total) by mouth daily. 90 capsule 3   Melatonin 3 MG TABS Take 1 tablet by mouth at bedtime.     Multiple Vitamin (MULTI-VITAMINS) TABS Take 1 tablet by mouth daily.     polyethylene glycol (MIRALAX / GLYCOLAX) packet Take 8.5 g by mouth daily.     traZODone (DESYREL) 50 MG tablet 50 mg at bedtime.     Valerian Root 450 MG CAPS Take 3 capsules by mouth at bedtime.     No current facility-administered medications on file prior to visit.    No Known Allergies  Past Medical History:  Diagnosis Date   Chronic constipation    Dysplastic nevus 06/02/2010   left distal dorsal foot   Dysplastic nevus 01/07/2018   left upper back lat to midline, moderate   Hyperlipidemia    Hypertension    Kidney stone    Melanoma in situ ([place]) 01/07/2018   right base of neck spinal top of shoulder   Sleep disturbance     Past Surgical History:  Procedure Laterality Date   CATARACT EXTRACTION W/ INTRAOCULAR LENS IMPLANT Bilateral    HEMORRHOID SURGERY     KIDNEY STONE SURGERY      Family History  Problem Relation Age of Onset   Tuberculosis Mother    COPD Father        from mustard gas in [place]   Kidney cancer  Neg Hx    Kidney disease Neg Hx    Prostate cancer Neg Hx    Diabetes Neg Hx    Heart disease Neg Hx     Social History   Socioeconomic History   Marital status: Widowed    Spouse name: Not on file   Number of children: 2   Years of education: Not on file   Highest education level: Not on file  Occupational History   Occupation: Therapist, 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    Comment: [employer] then [employer] for [employer]  Tobacco Use   Smoking status: Former    Types: Cigarettes    Quit date: 10/10/1963    Years since quitting: 57.6   Smokeless tobacco: Never  Substance and Sexual Activity   Alcohol use: Yes    Comment: occasional   Drug use: Not on file   Sexual activity: Not on file  Other Topics Concern   Not on file  Social History Narrative   Wife died in Jan 12, 2003   2 daughters      Has living will   Daughters would be health care POA   Requests DNR---done 10/10/18   No tube feeds   Social Determinants of Health   Financial Resource Strain: Not on file  Food Insecurity: Not  on file  Transportation Needs: Not on file  Physical Activity: Not on file  Stress: Not on file  Social Connections: Not on file  Intimate Partner Violence: Not on file   Review of Systems Appetite is fair Weight stable Remains continent    Objective:   Physical Exam Constitutional:      Appearance: Normal appearance.  Cardiovascular:     Rate and Rhythm: Normal rate and regular rhythm.     Heart sounds:    No gallop.     Comments: Soft aortic systolic murmur Pulmonary:     Effort: Pulmonary effort is normal.     Breath sounds: Normal breath sounds. No wheezing or rales.  Abdominal:     Palpations: Abdomen is soft.     Tenderness: There is no abdominal tenderness.  Musculoskeletal:     Cervical back: Neck supple.     Right lower leg: No edema.     Left lower leg: No edema.  Lymphadenopathy:     Cervical: No cervical adenopathy.  Neurological:     Mental Status: He is alert.            Assessment & Plan:

## 2021-05-12 NOTE — Assessment & Plan Note (Signed)
GFR stable in the upper 40's

## 2021-05-12 NOTE — Assessment & Plan Note (Signed)
Does okay with miralax and colace He manages this himself

## 2021-05-12 NOTE — Assessment & Plan Note (Signed)
BP Readings from Last 3 Encounters:  05/12/21 133/78  02/03/21 134/78  10/21/20 138/76   Good control on HCTZ Labs stable

## 2021-05-23 ENCOUNTER — Ambulatory Visit: Payer: Medicare Other | Admitting: Dermatology

## 2021-06-27 ENCOUNTER — Ambulatory Visit: Payer: Medicare Other | Admitting: Dermatology

## 2021-07-12 ENCOUNTER — Encounter: Payer: Self-pay | Admitting: Dermatology

## 2021-07-12 ENCOUNTER — Other Ambulatory Visit: Payer: Self-pay

## 2021-07-12 ENCOUNTER — Ambulatory Visit (INDEPENDENT_AMBULATORY_CARE_PROVIDER_SITE_OTHER): Payer: Medicare Other | Admitting: Dermatology

## 2021-07-12 DIAGNOSIS — L820 Inflamed seborrheic keratosis: Secondary | ICD-10-CM | POA: Diagnosis not present

## 2021-07-12 DIAGNOSIS — L821 Other seborrheic keratosis: Secondary | ICD-10-CM | POA: Diagnosis not present

## 2021-07-12 DIAGNOSIS — L578 Other skin changes due to chronic exposure to nonionizing radiation: Secondary | ICD-10-CM

## 2021-07-12 DIAGNOSIS — Z872 Personal history of diseases of the skin and subcutaneous tissue: Secondary | ICD-10-CM | POA: Diagnosis not present

## 2021-07-12 DIAGNOSIS — L570 Actinic keratosis: Secondary | ICD-10-CM | POA: Diagnosis not present

## 2021-07-12 NOTE — Patient Instructions (Addendum)
Start in 5 weeks  Start 5FU  cream apply a thin coat to scalp and forehead once a day for 2 weeks   5-Fluorouracil Patient Education   Actinic keratoses are the dry, red scaly spots on the skin caused by sun damage. A portion of these spots can turn into skin cancer with time, and treating them can help prevent development of skin cancer.   Treatment of these spots requires removal of the defective skin cells. There are various ways to remove actinic keratoses, including freezing with liquid nitrogen, treatment with creams, or treatment with a blue light procedure in the office.   5-fluorouracil cream is a topical cream used to treat actinic keratoses. It works by interfering with the growth of abnormal fast-growing skin cells, such as actinic keratoses. These cells peel off and are replaced by healthy ones.   5-fluorouracil/calcipotriene is a combination of the 5-fluorouracil cream with a vitamin D analog cream called calcipotriene. The calcipotriene alone does not treat actinic keratoses. However, when it is combined with 5-fluorouracil, it helps the 5-fluorouracil treat the actinic keratoses much faster so that the same results can be achieved with a much shorter treatment time.  INSTRUCTIONS FOR 5-FLUOROURACIL  5-fluorouracil cream typically only needs to be used for 2 weeks.  A thin layer should be applied once a day to the treatment areas recommended by your physician.     Avoid contact with your eyes, nostrils, and mouth. Do not use 5-fluorouracil/calcipotriene cream on infected or open wounds.   You will develop redness, irritation and some crusting at areas where you have pre-cancer damage/actinic keratoses. IF YOU DEVELOP PAIN, BLEEDING, OR SIGNIFICANT CRUSTING, STOP THE TREATMENT EARLY - you have already gotten a good response and the actinic keratoses should clear up well.  Wash your hands after applying 5-fluorouracil 5% cream on your skin.   A moisturizer or sunscreen with a  minimum SPF 30 should be applied each morning.   Once you have finished the treatment, you can apply a thin layer of Vaseline twice a day to irritated areas to soothe and calm the areas more quickly. If you experience significant discomfort, contact your physician.  For some patients it is necessary to repeat the treatment for best results.  SIDE EFFECTS: When using 5-fluorouracil cream, you may have mild irritation, such as redness, dryness, swelling, or a mild burning sensation. This usually resolves within 2 weeks. The more actinic keratoses you have, the more redness and inflammation you can expect during treatment. Eye irritation has been reported rarely. If this occurs, please let us know.  If you have any trouble using this cream, please call the office. If you have any other questions about this information, please do not hesitate to ask me before you leave the office.    Cryotherapy Aftercare  Wash gently with soap and water everyday.   Apply Vaseline and Band-Aid daily until healed.     If you have any questions or concerns for your doctor, please call our main line at [phone] and press option 4 to reach your doctor's medical assistant. If no one answers, please leave a voicemail as directed and we will return your call as soon as possible. Messages left after 4 pm will be answered the following business day.   You may also send [place] a message via [facility]. We typically respond to MyChart messages within 1-2 business days.  For prescription refills, please ask your pharmacy to contact our office. Our fax number is [phone].  If you have an urgent issue when the clinic is closed that cannot wait until the next business day, you can page your doctor at the number below.    Please note that while we do our best to be available for urgent issues outside of office hours, we are not available 24/7.   If you have an urgent issue and are unable to reach [place], you may choose to seek  medical care at your doctor's office, retail clinic, urgent care center, or emergency room.  If you have a medical emergency, please immediately call 911 or go to the emergency department.  Pager Numbers  - Dr. Nehemiah Massed: [phone]  - Dr. Laurence Ferrari: [phone]  - Dr. Nicole Kindred: [phone]  In the event of inclement weather, please call our main line at [phone] for an update on the status of any delays or closures.  Dermatology Medication Tips: Please keep the boxes that topical medications come in in order to help keep track of the instructions about where and how to use these. Pharmacies typically print the medication instructions only on the boxes and not directly on the medication tubes.   If your medication is too expensive, please contact our office at [phone] option 4 or send [place] a message through [place].   We are unable to tell what your co-pay for medications will be in advance as this is different depending on your insurance coverage. However, we may be able to find a substitute medication at lower cost or fill out paperwork to get insurance to cover a needed medication.   If a prior authorization is required to get your medication covered by your insurance company, please allow [place] 1-2 business days to complete this process.  Drug prices often vary depending on where the prescription is filled and some pharmacies may offer cheaper prices.  The website www.goodrx.com contains coupons for medications through different pharmacies. The prices here do not account for what the cost may be with help from insurance (it may be cheaper with your insurance), but the website can give you the price if you did not use any insurance.  - You can print the associated coupon and take it with your prescription to the pharmacy.  - You may also stop by our office during regular business hours and pick up a GoodRx coupon card.  - If you need your prescription sent electronically to a  different pharmacy, notify our office through [facility] or by phone at [phone] option 4.

## 2021-07-12 NOTE — Progress Notes (Signed)
Follow-Up Visit   Subjective  Tyler Tapia is a 87 y.o. male who presents for the following: Actinic Keratosis (6 months f/u hx of Aks, sun exposed areas ).  The following portions of the chart were reviewed this encounter and updated as appropriate:   Tobacco  Allergies  Meds  Problems  Med Hx  Surg Hx  Fam Hx     Review of Systems:  No other skin or systemic complaints except as noted in HPI or Assessment and Plan.  Objective  Well appearing patient in no apparent distress; mood and affect are within normal limits.  A focused examination was performed including face,scalp,arms. Relevant physical exam findings are noted in the Assessment and Plan.  face and scalp (16) Erythematous thin papules/macules with gritty scale.   face,scalp (3) Erythematous keratotic or waxy stuck-on papule or plaque.    Assessment & Plan  AK (actinic keratosis) (16) face and scalp  Actinic keratoses are precancerous spots that appear secondary to cumulative UV radiation exposure/sun exposure over time. They are chronic with expected duration over 1 year. A portion of actinic keratoses will progress to squamous cell carcinoma of the skin. It is not possible to reliably predict which spots will progress to skin cancer and so treatment is recommended to prevent development of skin cancer.  Recommend daily broad spectrum sunscreen SPF 30+ to sun-exposed areas, reapply every 2 hours as needed.  Recommend staying in the shade or wearing long sleeves, sun glasses (UVA+UVB protection) and wide brim hats (4-inch brim around the entire circumference of the hat). Call for new or changing lesions.   Destruction of lesion - face and scalp Complexity: simple   Destruction method: cryotherapy   Informed consent: discussed and consent obtained   Timeout:  patient name, date of birth, surgical site, and procedure verified Lesion destroyed using liquid nitrogen: Yes   Region frozen until ice ball extended  beyond lesion: Yes   Outcome: patient tolerated procedure well with no complications   Post-procedure details: wound care instructions given    Inflamed seborrheic keratosis face,scalp  Destruction of lesion - face,scalp Complexity: simple   Destruction method: cryotherapy   Informed consent: discussed and consent obtained   Timeout:  patient name, date of birth, surgical site, and procedure verified Lesion destroyed using liquid nitrogen: Yes   Region frozen until ice ball extended beyond lesion: Yes   Outcome: patient tolerated procedure well with no complications   Post-procedure details: wound care instructions given    Seborrheic Keratoses - Stuck-on, waxy, tan-brown papules and/or plaques  - Benign-appearing - Discussed benign etiology and prognosis. - Observe - Call for any changes  Actinic Damage - Severe, confluent actinic changes with pre-cancerous actinic keratoses  - Severe, chronic, not at goal, secondary to cumulative UV radiation exposure over time - diffuse scaly erythematous macules and papules with underlying dyspigmentation - Discussed Prescription "Field Treatment" for Severe, Chronic Confluent Actinic Changes with Pre-Cancerous Actinic Keratoses  Start in 5 weeks  Start 5FU apply thin coat to scalp and forehead once a day for 2 weeks   Field treatment involves treatment of an entire area of skin that has confluent Actinic Changes (Sun/ Ultraviolet light damage) and PreCancerous Actinic Keratoses by method of PhotoDynamic Therapy (PDT) and/or prescription Topical Chemotherapy agents such as 5-fluorouracil, 5-fluorouracil/calcipotriene, and/or imiquimod.  The purpose is to decrease the number of clinically evident and subclinical PreCancerous lesions to prevent progression to development of skin cancer by chemically destroying early precancer changes that may  or may not be visible.  It has been shown to reduce the risk of developing skin cancer in the treated area.  As a result of treatment, redness, scaling, crusting, and open sores may occur during treatment course. One or more than one of these methods may be used and may have to be used several times to control, suppress and eliminate the PreCancerous changes. Discussed treatment course, expected reaction, and possible side effects. - Recommend daily broad spectrum sunscreen SPF 30+ to sun-exposed areas, reapply every 2 hours as needed.  - Staying in the shade or wearing long sleeves, sun glasses (UVA+UVB protection) and wide brim hats (4-inch brim around the entire circumference of the hat) are also recommended. - Call for new or changing lesions.   Return in about 6 months (around 01/10/2022) for AKS .  IMarye Round, CMA, am acting as scribe for Sarina Ser, MD .  Documentation: I have reviewed the above documentation for accuracy and completeness, and I agree with the above.  Sarina Ser, MD

## 2021-07-29 ENCOUNTER — Non-Acute Institutional Stay: Payer: Medicare Other | Admitting: Internal Medicine

## 2021-07-29 DIAGNOSIS — G479 Sleep disorder, unspecified: Secondary | ICD-10-CM

## 2021-07-29 DIAGNOSIS — I10 Essential (primary) hypertension: Secondary | ICD-10-CM

## 2021-07-29 DIAGNOSIS — N1831 Chronic kidney disease, stage 3a: Secondary | ICD-10-CM

## 2021-07-29 DIAGNOSIS — I358 Other nonrheumatic aortic valve disorders: Secondary | ICD-10-CM | POA: Diagnosis not present

## 2021-07-29 NOTE — Progress Notes (Signed)
Subjective:    Patient ID: Tyler Tapia, male    DOB: 1920-11-02, 87 y.o.   MRN: [id number]  HPI  Resident seen in [address] No new concerns from staff or resident, reviewed with RN.  He reports he sleeps well.  He walks without a device.  He is independent with ADLs.  He reports his appetite is fair but he does not feel like he has lost weight.  He denies urinary leakage.  He reports his bowels are okay.  He denies chest pain, shortness of breath, reflux or joint pain.  He reports his mood is okay.  HTN: His BP is controlled on HCTZ.  Insomnia: He has difficulty falling asleep and staying asleep.  He is taking Melatonin and Trazodone with good relief of symptoms.  CKD 3: His last creatinine was 1.29, GFR 46, 02/2021.  He is not currently on an ACEI or ARB.  He does not follow with nephrology.  Review of Systems     Past Medical History:  Diagnosis Date   Chronic constipation    Dysplastic nevus 06/02/2010   left distal dorsal foot   Dysplastic nevus 01/07/2018   left upper back lat to midline, moderate   Hyperlipidemia    Hypertension    Kidney stone    Melanoma in situ ([place]) 01/07/2018   right base of neck spinal top of shoulder   Sleep disturbance     Current Outpatient Medications  Medication Sig Dispense Refill   docusate sodium (COLACE) 100 MG capsule Take 200 mg by mouth daily.      hydrochlorothiazide (MICROZIDE) 12.5 MG capsule Take 1 capsule (12.5 mg total) by mouth daily. 90 capsule 3   Melatonin 3 MG TABS Take 1 tablet by mouth at bedtime.     Multiple Vitamin (MULTI-VITAMINS) TABS Take 1 tablet by mouth daily.     polyethylene glycol (MIRALAX / GLYCOLAX) packet Take 8.5 g by mouth daily.     traZODone (DESYREL) 50 MG tablet 50 mg at bedtime.     Valerian Root 450 MG CAPS Take 3 capsules by mouth at bedtime.     No current facility-administered medications for this visit.    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    Comment: [employer] then [employer] for [employer]  Tobacco Use   Smoking status: Former    Types: Cigarettes    Quit date: 10/10/1963    Years since quitting: 57.8   Smokeless tobacco: Never  Substance and Sexual Activity   Alcohol use: Yes    Comment: occasional   Drug use: Not on file   Sexual activity: Not on file  Other Topics Concern   Not on file  Social History Narrative   Wife died in Jan 13, 2003   2 daughters      Has living will   Daughters would  be health care POA   Requests DNR---done 10/10/18   No tube feeds   Social Determinants of Health   Financial Resource Strain: Not on file  Food Insecurity: Not on file  Transportation Needs: Not on file  Physical Activity: Not on file  Stress: Not on file  Social Connections: Not on file  Intimate Partner Violence: Not on file     Constitutional: Denies fever, malaise, fatigue, headache or abrupt weight changes.  HEENT: Denies eye pain, eye redness, ear pain, ringing in the ears, wax buildup, runny nose, nasal congestion, bloody nose, or sore throat. Respiratory: Denies difficulty breathing, shortness of breath, cough or sputum production.   Cardiovascular: Denies chest pain, chest tightness, palpitations or swelling in the hands or feet.  Gastrointestinal: Denies abdominal pain, bloating, constipation, diarrhea or blood in the stool.  GU: Denies urgency, frequency, pain with urination, burning sensation, blood in urine, odor or discharge. Musculoskeletal: Denies decrease in range of  motion, difficulty with gait, muscle pain or joint pain and swelling.  Skin: Denies redness, rashes, lesions or ulcercations.  Neurological: Denies dizziness, difficulty with memory, difficulty with speech or problems with balance and coordination.  Psych: Denies anxiety, depression, SI/HI.  No other specific complaints in a complete review of systems (except as listed in HPI above).  Objective:   Physical Exam   BP 138/68   Pulse 80   Temp 98.4 F (36.9 C)   Resp 18   Wt 150 lb 14.4 oz (68.4 kg)   SpO2 100%   BMI 21.65 kg/m   Wt Readings from Last 3 Encounters:  05/12/21 150 lb (68 kg)  02/03/21 150 lb 9.6 oz (68.3 kg)  10/21/20 148 lb 6.4 oz (67.3 kg)    General: Appears his stated age, well developed, well nourished in NAD. Skin: Warm, dry and intact.  Cardiovascular: Normal rate and rhythm. S1,S2 noted.  Murmur noted. No JVD or BLE edema.  Pulmonary/Chest: Normal effort and positive vesicular breath sounds. No respiratory distress. No wheezes, rales or ronchi noted.  Abdomen: Soft and nontender. Normal bowel sounds.  Musculoskeletal: No difficulty with gait.  Neurological: Alert and oriented.  Psychiatric: Mood and affect normal. Behavior is normal. Judgment and thought content normal.    BMET    Component Value Date/Time   NA 140 05/13/2018 0621   K 3.5 05/13/2018 0621   CL 101 05/13/2018 0621   CO2 30 05/13/2018 0621   GLUCOSE 105 (H) 05/13/2018 0621   BUN 1 (A) 02/27/2019 0000   CREATININE 1.2 02/27/2019 0000   CREATININE 1.18 05/13/2018 0621   CALCIUM 8.9 05/13/2018 0621   GFRNONAA 51 (L) 05/13/2018 0621   GFRAA 59 (L) 05/13/2018 0621    Lipid Panel  No results found for: CHOL, TRIG, HDL, CHOLHDL, VLDL, LDLCALC  CBC    Component Value Date/Time   WBC 6.1 05/13/2018 0621   RBC 4.23 (L) 05/13/2018 0621   HGB 12.2 (A) 02/27/2019 0000   HGB 12.2 (A) 02/27/2019 0000   HCT 37 (A) 02/27/2019 0000   HCT 37 (A) 02/27/2019 0000   PLT 375 05/13/2018 0621    MCV 91.9 05/13/2018 0621   MCH 31.9 05/13/2018 0621   MCHC 34.7 05/13/2018 0621   RDW 12.9 05/13/2018 0621   LYMPHSABS 1.6 07/30/2017 0638   MONOABS 0.8 07/30/2017 0638   EOSABS 0.1 07/30/2017 0638   BASOSABS 0.1 07/30/2017 0638    Hgb A1C No results found for: HGBA1C        Assessment &  Plan:    Webb Silversmith, NP This visit occurred during the SARS-CoV-2 public health emergency.  Safety protocols were in place, including screening questions prior to the visit, additional usage of staff PPE, and extensive cleaning of exam room while observing appropriate contact time as indicated for disinfecting solutions.

## 2021-08-01 ENCOUNTER — Encounter: Payer: Self-pay | Admitting: Internal Medicine

## 2021-08-01 NOTE — Assessment & Plan Note (Signed)
Asymptomatic Will monitor 

## 2021-08-01 NOTE — Assessment & Plan Note (Signed)
Continue Melatonin and Trazodone

## 2021-08-01 NOTE — Patient Instructions (Signed)
Chronic Kidney Disease, Adult Chronic kidney disease is when lasting damage happens to the kidneys slowly over a long time. The kidneys help to: Make pee (urine). Make hormones. Keep the right amount of fluids and chemicals in the body. Most often, this disease does not go away. You must take steps to help keep the kidney damage from getting worse. If steps are not taken, the kidneys might stop working forever. What are the causes? Diabetes. High blood pressure. Diseases that affect the heart and blood vessels. Other kidney diseases. Diseases of the body's disease-fighting system. A problem with the flow of pee. Infections of the organs that make pee, store it, and take it out of the body. Swelling or irritation of your blood vessels. What increases the risk? Getting older. Having someone in your family who has kidney disease or kidney failure. Having a disease caused by genes. Taking medicines often that harm the kidneys. Being near or having contact with harmful substances. Being very overweight. Using tobacco now or in the past. What are the signs or symptoms? Feeling very tired. Having a swollen face, legs, ankles, or feet. Feeling like you may vomit or vomiting. Not feeling hungry. Being confused or not able to focus. Twitches and cramps in the leg muscles or other muscles. Dry, itchy skin. A taste of metal in your mouth. Making less pee, or making more pee. Shortness of breath. Trouble sleeping. You may also become anemic or get weak bones. Anemic means there is not enough red blood cells or hemoglobin in your blood. You may get symptoms slowly. You may not notice them until the kidney damage gets very bad. How is this treated? Often, there is no cure for this disease. Treatment can help with symptoms and help keep the disease from getting worse. You may need to: Avoid alcohol. Avoid foods that are high in salt, potassium, phosphorous, and protein. Take medicines for  symptoms and to help control other conditions. Have dialysis. This treatment gets harmful waste out of your body. Treat other problems that cause your kidney disease or make it worse. Follow these instructions at home: Medicines Take over-the-counter and prescription medicines only as told by your doctor. Do not take any new medicines, vitamins, or supplements unless your doctor says it is okay. Lifestyle  Do not smoke or use any products that contain nicotine or tobacco. If you need help quitting, ask your doctor. If you drink alcohol: Limit how much you use to: 0-1 drink a day for women who are not pregnant. 0-2 drinks a day for men. Know how much alcohol is in your drink. In the [place] one drink equals one 12 oz bottle of beer (355 mL), one 5 oz glass of wine (148 mL), or one 1 oz glass of hard liquor (44 mL). Stay at a healthy weight. If you need help losing weight, ask your doctor. General instructions  Follow instructions from your doctor about what you cannot eat or drink. Track your blood pressure at home. Tell your doctor about any changes. If you have diabetes, track your blood sugar. Exercise at least 30 minutes a day, 5 days a week. Keep your shots (vaccinations) up to date. Keep all follow-up visits. Where to find more information American Association of Kidney Patients: www.aakp.org National Kidney Foundation: www.kidney.org American Kidney Fund: www.akfinc.org Life Options: www.lifeoptions.org Kidney School: www.kidneyschool.org Contact a doctor if: Your symptoms get worse. You get new symptoms. Get help right away if: You get symptoms of end-stage kidney disease. These   include: Headaches. Losing feeling in your hands or feet. Easy bruising. Having hiccups often. Chest pain. Shortness of breath. Lack of menstrual periods, in women. You have a fever. You make less pee than normal. You have pain or you bleed when you pee or poop. These symptoms may be an  emergency. Get help right away. Call your local emergency services (911 in the [place] Do not wait to see if the symptoms will go away. Do not drive yourself to the hospital. Summary Chronic kidney disease is when lasting damage happens to the kidneys slowly over a long time. Causes of this disease include diabetes and high blood pressure. Often, there is no cure for this disease. Treatment can help symptoms and help keep the disease from getting worse. Treatment may involve lifestyle changes, medicines, and dialysis. This information is not intended to replace advice given to you by your health care provider. Make sure you discuss any questions you have with your health care provider. Document Revised: 12/31/2019 Document Reviewed: 12/31/2019 Elsevier Patient Education  2022 [employer]vier Inc.  

## 2021-08-01 NOTE — Assessment & Plan Note (Signed)
We will check Bmet in November

## 2021-08-01 NOTE — Assessment & Plan Note (Signed)
Controlled on HCTZ Will monitor

## 2021-10-27 ENCOUNTER — Ambulatory Visit: Payer: Medicare Other | Admitting: Internal Medicine

## 2021-10-27 ENCOUNTER — Other Ambulatory Visit: Payer: Self-pay

## 2021-10-27 ENCOUNTER — Encounter: Payer: Self-pay | Admitting: Internal Medicine

## 2021-10-27 VITALS — BP 126/69 | HR 68 | Temp 97.8°F | Resp 18 | Wt 144.4 lb

## 2021-10-27 DIAGNOSIS — I10 Essential (primary) hypertension: Secondary | ICD-10-CM

## 2021-10-27 DIAGNOSIS — N1831 Chronic kidney disease, stage 3a: Secondary | ICD-10-CM | POA: Diagnosis not present

## 2021-10-27 DIAGNOSIS — I358 Other nonrheumatic aortic valve disorders: Secondary | ICD-10-CM

## 2021-10-27 DIAGNOSIS — K5909 Other constipation: Secondary | ICD-10-CM

## 2021-10-27 DIAGNOSIS — G479 Sleep disorder, unspecified: Secondary | ICD-10-CM

## 2021-10-27 NOTE — Progress Notes (Signed)
Subjective:    Patient ID: Tyler Tapia, male    DOB: 14-Jan-1921, 88 y.o.   MRN: [id number]  HPI Visit in assisted living apartment for review of chronic health conditions Reviewed status with Luellen Pucker RN  Doing okay Remains functionally independent  Continent Some concern from hairdresser about whether he was showering He thinks he is doing it 2 times a week---discussed considering doing it more often  Appetite is not as good Weight is down a few pounds Some decrease in exercise due to recent COVID outbreak--but still walks daily  No chest pain No palpitations No SOB and walking pace is stable No edema No dizziness  Mildly decreased GFR ---will be due again in May  Sleeps okay Still on valerian root, trazodone and melatonin  Current Outpatient Medications on File Prior to Visit  Medication Sig Dispense Refill   docusate sodium (COLACE) 100 MG capsule Take 200 mg by mouth daily.      hydrochlorothiazide (MICROZIDE) 12.5 MG capsule Take 1 capsule (12.5 mg total) by mouth daily. 90 capsule 3   Melatonin 3 MG TABS Take 1 tablet by mouth at bedtime.     Multiple Vitamin (MULTI-VITAMINS) TABS Take 1 tablet by mouth daily.     polyethylene glycol (MIRALAX / GLYCOLAX) packet Take 8.5 g by mouth daily.     traZODone (DESYREL) 50 MG tablet 50 mg at bedtime.     Valerian Root 450 MG CAPS Take 3 capsules by mouth at bedtime.     No current facility-administered medications on file prior to visit.    No Known Allergies  Past Medical History:  Diagnosis Date   Chronic constipation    Dysplastic nevus 06/02/2010   left distal dorsal foot   Dysplastic nevus 01/07/2018   left upper back lat to midline, moderate   Hyperlipidemia    Hypertension    Kidney stone    Melanoma in situ ([place]) 01/07/2018   right base of neck spinal top of shoulder   Sleep disturbance     Past Surgical History:  Procedure Laterality Date   CATARACT EXTRACTION W/ INTRAOCULAR LENS IMPLANT Bilateral     HEMORRHOID SURGERY     KIDNEY STONE SURGERY      Family History  Problem Relation Age of Onset   Tuberculosis Mother    COPD Father        from mustard gas in WW1   Kidney cancer Neg Hx    Kidney disease Neg Hx    Prostate cancer Neg Hx    Diabetes Neg Hx    Heart disease Neg Hx     Social History   Socioeconomic History   Marital status: Widowed    Spouse name: Not on file   Number of children: 2   Years of education: Not on file   Highest education level: Not on file  Occupational History   Occupation: Therapist, 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    Comment: [employer] then [employer] for [employer]  Tobacco Use   Smoking status: Former    Types: Cigarettes    Quit date: 10/10/1963    Years since quitting: 58.0   Smokeless tobacco: Never  Substance and Sexual Activity   Alcohol use: Yes    Comment: occasional   Drug use: Not on file   Sexual activity: Not on file  Other Topics Concern   Not on file  Social History Narrative   Wife died in 01/12/03   2 daughters      Has living will  Daughters would be health care POA   Requests DNR---done 10/10/18   No tube feeds   Social Determinants of Health   Financial Resource Strain: Not on file  Food Insecurity: Not on file  Transportation Needs: Not on file  Physical Activity: Not on file  Stress: Not on file  Social Connections: Not on file  Intimate Partner Violence: Not on file   Review of Systems Bowels okay on miralax and colace Voids fine No sig back or joint pains Has itchy back---Gold Bond lotion helps that     Objective:   Physical Exam Constitutional:      Appearance: Normal appearance.  Cardiovascular:     Rate and Rhythm: Normal rate and regular rhythm.     Heart sounds:    No gallop.     Comments: Gr 2/6 aortic systolic murmur Pulmonary:     Effort: Pulmonary effort is normal.     Breath sounds: Normal breath sounds. No wheezing or rales.  Abdominal:     Palpations: Abdomen is soft.     Tenderness:  There is no abdominal tenderness.  Musculoskeletal:     Cervical back: Neck supple.     Right lower leg: No edema.     Left lower leg: No edema.  Lymphadenopathy:     Cervical: No cervical adenopathy.  Skin:    Findings: No rash.  Neurological:     Mental Status: He is alert.  Psychiatric:        Mood and Affect: Mood normal.        Behavior: Behavior normal.           Assessment & Plan:

## 2021-10-27 NOTE — Assessment & Plan Note (Signed)
No chest pain, dizziness or CHF Would not evaluate further unless concerning symptoms

## 2021-10-27 NOTE — Assessment & Plan Note (Signed)
Mild  No action in 88 year old

## 2021-10-27 NOTE — Assessment & Plan Note (Signed)
Does well with miralax 17 gm and colace 100mg  daily

## 2021-10-27 NOTE — Assessment & Plan Note (Signed)
He is satisfied with valerian root 450mg , trazodone 50mg  and melatonin 3mg  nightly

## 2021-10-27 NOTE — Assessment & Plan Note (Signed)
BP Readings from Last 3 Encounters:  10/27/21 126/69  08/01/21 138/68  05/12/21 133/78   Doing well with HCTZ 12.5mg  daily

## 2021-12-15 ENCOUNTER — Telehealth: Payer: Self-pay

## 2021-12-15 DIAGNOSIS — R21 Rash and other nonspecific skin eruption: Secondary | ICD-10-CM

## 2021-12-15 MED ORDER — TRIAMCINOLONE ACETONIDE 0.1 % EX CREA: TOPICAL_CREAM | CUTANEOUS | 0 refills | Status: AC

## 2021-12-15 NOTE — Telephone Encounter (Signed)
Received refill request from pharmacy

## 2021-12-22 ENCOUNTER — Encounter: Payer: Self-pay | Admitting: Dermatology

## 2022-01-18 ENCOUNTER — Ambulatory Visit: Payer: Medicare Other | Admitting: Dermatology

## 2022-02-03 ENCOUNTER — Non-Acute Institutional Stay: Payer: Medicare Other | Admitting: Internal Medicine

## 2022-02-03 ENCOUNTER — Encounter: Payer: Self-pay | Admitting: Internal Medicine

## 2022-02-03 DIAGNOSIS — I10 Essential (primary) hypertension: Secondary | ICD-10-CM | POA: Diagnosis not present

## 2022-02-03 DIAGNOSIS — K5909 Other constipation: Secondary | ICD-10-CM | POA: Diagnosis not present

## 2022-02-03 DIAGNOSIS — I358 Other nonrheumatic aortic valve disorders: Secondary | ICD-10-CM

## 2022-02-03 DIAGNOSIS — N1831 Chronic kidney disease, stage 3a: Secondary | ICD-10-CM

## 2022-02-03 DIAGNOSIS — G479 Sleep disorder, unspecified: Secondary | ICD-10-CM

## 2022-02-03 NOTE — Assessment & Plan Note (Signed)
Controlled on HCTZ ?Will monitor ?

## 2022-02-03 NOTE — Progress Notes (Signed)
? ?Subjective:  ? ? Patient ID: Tyler Tapia, male    DOB: 18-May-1921, 88 y.o.   MRN: [id number] ? ?HPI ? ?Resident seen in ALF [address] ?No new concerns from staff or resident ?He sleeps well. Independent with ADL's. He walks without a device. He denies recent falls. His appetite is good, weight has been stable. His bowels move good with the medication. He denies urinary incontinence. He denies chest pain, SOB, joint pain or reflux. He reports his mood has been good but would be better if he wasn't locked in his room (covid outbreak) ? ?HTN: His BP today is 123/67. He is taking HCTZ as prescribed. ECG From 08/2017 reviewed. ? ?Insomnia: He is sleeping well with Melatonin and Trazodone.  ? ?Constipation: He reports his bowels are moving well with Mirilax and Docusate. ? ?CKD 3: His last creatinine was 1.29, GFR 45, 02/2021. He is not on an ACEI/ARB. ? ?Review of Systems ? ?   ?Past Medical History:  ?Diagnosis Date  ? Chronic constipation   ? Dysplastic nevus 06/02/2010  ? left distal dorsal foot  ? Dysplastic nevus 01/07/2018  ? left upper back lat to midline, moderate  ? Hyperlipidemia   ? Hypertension   ? Kidney stone   ? Melanoma in situ ([place]) 01/07/2018  ? right base of neck spinal top of shoulder. Excised 01/22/2018, margins free  ? Sleep disturbance   ? ? ?Current Outpatient Medications  ?Medication Sig Dispense Refill  ? docusate sodium (COLACE) 100 MG capsule Take 200 mg by mouth daily.     ? hydrochlorothiazide (MICROZIDE) 12.5 MG capsule Take 1 capsule (12.5 mg total) by mouth daily. 90 capsule 3  ? Melatonin 3 MG TABS Take 1 tablet by mouth at bedtime.    ? Multiple Vitamin (MULTI-VITAMINS) TABS Take 1 tablet by mouth daily.    ? polyethylene glycol (MIRALAX / GLYCOLAX) packet Take 8.5 g by mouth daily.    ? traZODone (DESYREL) 50 MG tablet 50 mg at bedtime.    ? triamcinolone cream (KENALOG) 0.1 % For eczema apply to aa's rash and itch BID 3 d/wk PRN. Avoid applying to face, groin, and axilla. Use as  directed. Long-term use can cause thinning of the skin. 80 g 0  ? Valerian Root 450 MG CAPS Take 3 capsules by mouth at bedtime.    ? ?No current facility-administered medications for this visit.  ? ? ?No Known Allergies ? ?Family History  ?Problem Relation Age of Onset  ? Tuberculosis Mother   ? COPD Father   ?     from mustard gas in [facility]  ? Kidney cancer Neg Hx   ? Kidney disease Neg Hx   ? Prostate cancer Neg Hx   ? Diabetes Neg Hx   ? Heart disease Neg Hx   ? ? ?Social History  ? ?Socioeconomic History  ? Marital status: Widowed  ?  Spouse name: Not on file  ? Number of children: 2  ? Years of education: Not on file  ? Highest education level: Not on file  ?Occupational History  ? Occupation: Archivist  ?  Comment: [employer] then [employer] for [employer]  ?Tobacco Use  ? Smoking status: Former  ?  Types: Cigarettes  ?  Quit date: 10/10/1963  ?  Years since quitting: 58.3  ? Smokeless tobacco: Never  ?Substance and Sexual Activity  ? Alcohol use: Yes  ?  Comment: occasional  ? Drug use: Not on file  ? Sexual  activity: Not on file  ?Other Topics Concern  ? Not on file  ?Social History Narrative  ? Wife died in 2003/01/08  ? 2 daughters  ?   ? Has living will  ? Daughters would be health care POA  ? Requests DNR---done 10/10/18  ? No tube feeds  ? ?Social Determinants of Health  ? ?Financial Resource Strain: Not on file  ?Food Insecurity: Not on file  ?Transportation Needs: Not on file  ?Physical Activity: Not on file  ?Stress: Not on file  ?Social Connections: Not on file  ?Intimate Partner Violence: Not on file  ? ? ? ?Constitutional: Denies fever, malaise, fatigue, headache or abrupt weight changes.  ?HEENT: Denies eye pain, eye redness, ear pain, ringing in the ears, wax buildup, runny nose, nasal congestion, bloody nose, or sore throat. ?Respiratory: Denies difficulty breathing, shortness of breath, cough or sputum production.   ?Cardiovascular: Denies chest pain, chest tightness, palpitations or  swelling in the hands or feet.  ?Gastrointestinal: Denies abdominal pain, bloating, constipation, diarrhea or blood in the stool.  ?GU: Denies urgency, frequency, pain with urination, burning sensation, blood in urine, odor or discharge. ?Musculoskeletal: Denies decrease in range of motion, difficulty with gait, muscle pain or joint pain and swelling.  ?Skin: Denies redness, rashes, lesions or ulcercations.  ?Neurological: Denies dizziness, difficulty with memory, difficulty with speech or problems with balance and coordination.  ?Psych: Denies anxiety, depression, SI/HI. ? ?No other specific complaints in a complete review of systems (except as listed in HPI above). ? ?Objective:  ? Physical Exam ? ?BP 123/67   Pulse 63   Temp (!) 97.3 ?F (36.3 ?C)   Resp 18   Wt 144 lb 6.4 oz (65.5 kg)   SpO2 98%   BMI 20.72 kg/m?  ? ?Wt Readings from Last 3 Encounters:  ?10/27/21 144 lb 6.4 oz (65.5 kg)  ?08/01/21 150 lb 14.4 oz (68.4 kg)  ?05/12/21 150 lb (68 kg)  ? ? ?General: Appears his stated age, well developed, well nourished in NAD. ?HEENT: Head: normal shape and size; Eyes: sclera white and EOMs intact;  ?Cardiovascular: Normal rate and rhythm. S1,S2 noted.  Murmur noted. No JVD or BLE edema.  ?Pulmonary/Chest: Normal effort and positive vesicular breath sounds. No respiratory distress. No wheezes, rales or ronchi noted.  ?Abdomen: Soft and nontender. Normal bowel sounds.  ?Musculoskeletal: No difficulty with gait.  ?Neurological: Alert and oriented.  ?Psychiatric: Mood and affect normal.  ?BMET ?   ?Component Value Date/Time  ? NA 140 05/13/2018 0621  ? K 3.5 05/13/2018 0621  ? CL 101 05/13/2018 0621  ? CO2 30 05/13/2018 0621  ? GLUCOSE 105 (H) 05/13/2018 5009  ? BUN 1 (A) 02/27/2019 0000  ? CREATININE 1.2 02/27/2019 0000  ? CREATININE 1.18 05/13/2018 0621  ? CALCIUM 8.9 05/13/2018 0621  ? GFRNONAA 51 (L) 05/13/2018 3818  ? GFRAA 59 (L) 05/13/2018 2993  ? ? ?Lipid Panel  ?No results found for: CHOL, TRIG, HDL,  CHOLHDL, VLDL, LDLCALC ? ?CBC ?   ?Component Value Date/Time  ? WBC 6.1 05/13/2018 0621  ? RBC 4.23 (L) 05/13/2018 7169  ? HGB 12.2 (A) 02/27/2019 0000  ? HGB 12.2 (A) 02/27/2019 0000  ? HCT 37 (A) 02/27/2019 0000  ? HCT 37 (A) 02/27/2019 0000  ? PLT 375 05/13/2018 0621  ? MCV 91.9 05/13/2018 0621  ? MCH 31.9 05/13/2018 0621  ? MCHC 34.7 05/13/2018 0621  ? RDW 12.9 05/13/2018 0621  ? LYMPHSABS 1.6 07/30/2017 0638  ?  MONOABS 0.8 07/30/2017 0638  ? EOSABS 0.1 07/30/2017 4383  ? BASOSABS 0.1 07/30/2017 7793  ? ? ?Hgb A1C ?No results found for: HGBA1C ? ? ? ? ? ?   ?Assessment & Plan:  ? ? ? ?Webb Silversmith, NP ? ?

## 2022-02-03 NOTE — Assessment & Plan Note (Signed)
Asymptomatic ?No meds ?

## 2022-02-03 NOTE — Assessment & Plan Note (Signed)
Due for yearly labs in May ?

## 2022-02-03 NOTE — Assessment & Plan Note (Signed)
Continue Melatonin and Trazadone ?

## 2022-02-03 NOTE — Assessment & Plan Note (Signed)
Continue Mirilax and Docusate ?Encouraged high fiber diet and adequate water intake ?

## 2022-02-08 ENCOUNTER — Ambulatory Visit (INDEPENDENT_AMBULATORY_CARE_PROVIDER_SITE_OTHER): Payer: Medicare Other | Admitting: Dermatology

## 2022-02-08 DIAGNOSIS — L578 Other skin changes due to chronic exposure to nonionizing radiation: Secondary | ICD-10-CM

## 2022-02-08 DIAGNOSIS — L570 Actinic keratosis: Secondary | ICD-10-CM | POA: Diagnosis not present

## 2022-02-08 DIAGNOSIS — L814 Other melanin hyperpigmentation: Secondary | ICD-10-CM

## 2022-02-08 DIAGNOSIS — D692 Other nonthrombocytopenic purpura: Secondary | ICD-10-CM | POA: Diagnosis not present

## 2022-02-08 NOTE — Patient Instructions (Addendum)

## 2022-02-08 NOTE — Progress Notes (Signed)
? ?  Follow-Up Visit ?  ?Subjective  ?Tyler Tapia is a 88 y.o. male who presents for the following: Actinic Keratosis (Face, scalp, 92mf/u). ?The patient has spots, moles and lesions to be evaluated, some may be new or changing and the patient has concerns that these could be cancer. ? ?The following portions of the chart were reviewed this encounter and updated as appropriate:  ? Tobacco  Allergies  Meds  Problems  Med Hx  Surg Hx  Fam Hx   ?  ?Review of Systems:  No other skin or systemic complaints except as noted in HPI or Assessment and Plan. ? ?Objective  ?Well appearing patient in no apparent distress; mood and affect are within normal limits. ? ?A focused examination was performed including face, scalp. Relevant physical exam findings are noted in the Assessment and Plan. ? ?Scalp x 5, nose x 1 (6) ?Pink scaly macules ? ? ?Assessment & Plan  ? ?Actinic Damage ?- chronic, secondary to cumulative UV radiation exposure/sun exposure over time ?- diffuse scaly erythematous macules with underlying dyspigmentation ?- Recommend daily broad spectrum sunscreen SPF 30+ to sun-exposed areas, reapply every 2 hours as needed.  ?- Recommend staying in the shade or wearing long sleeves, sun glasses (UVA+UVB protection) and wide brim hats (4-inch brim around the entire circumference of the hat). ?- Call for new or changing lesions.  ? ?Purpura - Chronic; persistent and recurrent.  Treatable, but not curable. ?- Violaceous macules and patches ?- Benign ?- Related to trauma, age, sun damage and/or use of blood thinners, chronic use of topical and/or oral steroids ?- Observe ?- Can use OTC arnica containing moisturizer such as Dermend Bruise Formula if desired ?- Call for worsening or other concerns ? ?Lentigines ?- Scattered tan macules ?- Due to sun exposure ?- Benign-appering, observe ?- Recommend daily broad spectrum sunscreen SPF 30+ to sun-exposed areas, reapply every 2 hours as needed. ?- Call for any  changes ? ?AK (actinic keratosis) (6) ?Scalp x 5, nose x 1 ? ?Destruction of lesion - Scalp x 5, nose x 1 ?Complexity: simple   ?Destruction method: cryotherapy   ?Informed consent: discussed and consent obtained   ?Timeout:  patient name, date of birth, surgical site, and procedure verified ?Lesion destroyed using liquid nitrogen: Yes   ?Region frozen until ice ball extended beyond lesion: Yes   ?Outcome: patient tolerated procedure well with no complications   ?Post-procedure details: wound care instructions given   ? ? ?Return in about 6 months (around 08/11/2022) for AK f/u. ? ?I, SOthelia Pulling RMA, am acting as scribe for DSarina Ser MD . ?Documentation: I have reviewed the above documentation for accuracy and completeness, and I agree with the above. ? ?DSarina Ser MD ? ?

## 2022-02-21 ENCOUNTER — Encounter: Payer: Self-pay | Admitting: Dermatology

## 2022-03-15 ENCOUNTER — Non-Acute Institutional Stay: Payer: Medicare Other | Admitting: Internal Medicine

## 2022-03-15 VITALS — BP 138/76 | HR 72

## 2022-03-15 DIAGNOSIS — N481 Balanitis: Secondary | ICD-10-CM | POA: Diagnosis not present

## 2022-03-15 NOTE — Progress Notes (Signed)
Subjective:    Patient ID: Tyler Tapia, male    DOB: Mar 24, 1921, 88 y.o.   MRN: [id number]  HPI  Resident seen in [address] RN reports redness and irritation around the head of his penis.  He denies any issues with itching, burning, urinary frequency, urgency or dysuria. He was given 1 dose of Diflucan 150 mg prior to this evaluation.  Review of Systems     Past Medical History:  Diagnosis Date   Chronic constipation    Dysplastic nevus 06/02/2010   left distal dorsal foot   Dysplastic nevus 01/07/2018   left upper back lat to midline, moderate   Hyperlipidemia    Hypertension    Kidney stone    Melanoma in situ ([place]) 01/07/2018   right base of neck spinal top of shoulder. Excised 01/22/2018, margins free   Sleep disturbance     Current Outpatient Medications  Medication Sig Dispense Refill   docusate sodium (COLACE) 100 MG capsule Take 200 mg by mouth daily.      hydrochlorothiazide (MICROZIDE) 12.5 MG capsule Take 1 capsule (12.5 mg total) by mouth daily. 90 capsule 3   Melatonin 3 MG TABS Take 1 tablet by mouth at bedtime.     Multiple Vitamin (MULTI-VITAMINS) TABS Take 1 tablet by mouth daily.     polyethylene glycol (MIRALAX / GLYCOLAX) packet Take 8.5 g by mouth daily.     traZODone (DESYREL) 50 MG tablet 50 mg at bedtime.     triamcinolone cream (KENALOG) 0.1 % For eczema apply to aa's rash and itch BID 3 d/wk PRN. Avoid applying to face, groin, and axilla. Use as directed. Long-term use can cause thinning of the skin. 80 g 0   No current facility-administered medications for this visit.    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    Comment: [employer] then [employer] for [employer]  Tobacco Use   Smoking status: Former    Types: Cigarettes    Quit date: 10/10/1963    Years since quitting: 58.4   Smokeless tobacco: Never  Substance and Sexual Activity   Alcohol use: Yes    Comment: occasional   Drug use: Not on file   Sexual activity: Not on file  Other Topics Concern   Not on file  Social History Narrative   Wife died in 2003-01-09   2 daughters      Has living will   Daughters would be health care POA   Requests DNR---done 10/10/18   No tube feeds   Social Determinants of Health   Financial Resource Strain: Not on file  Food Insecurity: Not on file  Transportation Needs: Not on file  Physical Activity: Not on file  Stress: Not on file  Social Connections: Not on file  Intimate Partner Violence: Not on file     Constitutional:  Denies fever, malaise, fatigue, headache or abrupt weight changes.  Respiratory: Denies difficulty breathing, shortness of breath, cough or sputum production.   Cardiovascular: Denies chest pain, chest tightness, palpitations or swelling in the hands or feet.  GU: Patient reports penile irritation and redness.  Denies urgency, frequency, pain with urination, burning sensation, blood in urine, odor.  No other specific complaints in a complete review of systems (except as listed in HPI above).  Objective:   Physical Exam  BP 138/76   Pulse 72    Wt Readings from Last 3 Encounters:  02/03/22 144 lb 6.4 oz (65.5 kg)  10/27/21 144 lb 6.4 oz (65.5 kg)  08/01/21 150 lb 14.4 oz (68.4 kg)    General: Appears his stated age, well developed, well nourished in NAD. Cardiovascular: Normal rate. Pulmonary/Chest: Normal effort and positive vesicular breath sounds. No respiratory distress. No wheezes, rales or ronchi noted.  GU:  Normal male anatomy.  Uncircumcised.  Redness noted of the glans penis with hypopigmented rash.  BMET    Component Value Date/Time   NA 140 05/13/2018 0621   K 3.5 05/13/2018 0621   CL 101 05/13/2018 0621   CO2 30 05/13/2018 0621   GLUCOSE 105 (H) 05/13/2018 0621   BUN 1 (A) 02/27/2019 0000   CREATININE 1.2 02/27/2019 0000   CREATININE 1.18 05/13/2018 0621   CALCIUM 8.9 05/13/2018 0621   GFRNONAA 51 (L) 05/13/2018 0621   GFRAA 59 (L) 05/13/2018 0621    Lipid Panel  No results found for: CHOL, TRIG, HDL, CHOLHDL, VLDL, LDLCALC  CBC    Component Value Date/Time   WBC 6.1 05/13/2018 0621   RBC 4.23 (L) 05/13/2018 0621   HGB 12.2 (A) 02/27/2019 0000   HGB 12.2 (A) 02/27/2019 0000   HCT 37 (A) 02/27/2019 0000   HCT 37 (A) 02/27/2019 0000   PLT 375 05/13/2018 0621   MCV 91.9 05/13/2018 0621   MCH 31.9 05/13/2018 0621   MCHC 34.7 05/13/2018 0621   RDW 12.9 05/13/2018 0621   LYMPHSABS 1.6 07/30/2017 0638   MONOABS 0.8 07/30/2017 0638   EOSABS 0.1 07/30/2017 0638   BASOSABS 0.1 07/30/2017 0638    Hgb A1C No results found for: HGBA1C         Assessment & Plan:   Balanitis:  Rx for Diflucan 100 mg daily x7 days Try to keep this area dry and free from moisture  We will reassess as needed Webb Silversmith, NP

## 2022-03-16 ENCOUNTER — Encounter: Payer: Self-pay | Admitting: Internal Medicine

## 2022-04-20 ENCOUNTER — Encounter: Payer: Self-pay | Admitting: Internal Medicine

## 2022-04-20 ENCOUNTER — Ambulatory Visit: Payer: Medicare Other | Admitting: Internal Medicine

## 2022-04-20 VITALS — BP 136/76 | HR 62 | Temp 98.9°F | Resp 18 | Wt 144.0 lb

## 2022-04-20 DIAGNOSIS — N1831 Chronic kidney disease, stage 3a: Secondary | ICD-10-CM

## 2022-04-20 DIAGNOSIS — I10 Essential (primary) hypertension: Secondary | ICD-10-CM

## 2022-04-20 DIAGNOSIS — G3184 Mild cognitive impairment, so stated: Secondary | ICD-10-CM

## 2022-04-20 DIAGNOSIS — I350 Nonrheumatic aortic (valve) stenosis: Secondary | ICD-10-CM

## 2022-04-20 DIAGNOSIS — G479 Sleep disorder, unspecified: Secondary | ICD-10-CM | POA: Diagnosis not present

## 2022-04-20 NOTE — Assessment & Plan Note (Signed)
Mild No action about this

## 2022-04-20 NOTE — Assessment & Plan Note (Signed)
Forgetting to shower and change clothes Otherwise---stable and independent function Will have staff give reminders on those items

## 2022-04-20 NOTE — Assessment & Plan Note (Signed)
Does okay with valerian, melatonin and trazodone 50

## 2022-04-20 NOTE — Progress Notes (Signed)
Subjective:    Patient ID: Tyler Tapia, male    DOB: 10-28-1920, 88 y.o.   MRN: [id number]  HPI Visit in assisted living apartment for review of chronic health conditions Reviewed status with Collier Salina RN  He feels things are good Still some concerns about his memory problems---forgetting to change clothes and shower He does admit "I am a bit sloppy about remembering to take a shower" He prefers not to ask for help---and still able to do all ADLs himself  No chest pain No SOB No dizziness or syncope No edema---just notes a little indentation at top of socks No palpitations  Last GFR--58  Current Outpatient Medications on File Prior to Visit  Medication Sig Dispense Refill   docusate sodium (COLACE) 100 MG capsule Take 200 mg by mouth daily.      hydrochlorothiazide (MICROZIDE) 12.5 MG capsule Take 1 capsule (12.5 mg total) by mouth daily. 90 capsule 3   Melatonin 3 MG TABS Take 1 tablet by mouth at bedtime.     Multiple Vitamin (MULTI-VITAMINS) TABS Take 1 tablet by mouth daily.     polyethylene glycol (MIRALAX / GLYCOLAX) packet Take 8.5 g by mouth daily.     traZODone (DESYREL) 50 MG tablet 50 mg at bedtime.     triamcinolone cream (KENALOG) 0.1 % For eczema apply to aa's rash and itch BID 3 d/wk PRN. Avoid applying to face, groin, and axilla. Use as directed. Long-term use can cause thinning of the skin. 80 g 0   No current facility-administered medications on file prior to visit.    No Known Allergies  Past Medical History:  Diagnosis Date   Chronic constipation    Dysplastic nevus 06/02/2010   left distal dorsal foot   Dysplastic nevus 01/07/2018   left upper back lat to midline, moderate   Hyperlipidemia    Hypertension    Kidney stone    Melanoma in situ ([place]) 01/07/2018   right base of neck spinal top of shoulder. Excised 01/22/2018, margins free   Sleep disturbance     Past Surgical History:  Procedure Laterality Date   CATARACT EXTRACTION W/ INTRAOCULAR  LENS IMPLANT Bilateral    HEMORRHOID SURGERY     KIDNEY STONE SURGERY      Family History  Problem Relation Age of Onset   Tuberculosis Mother    COPD Father        from mustard gas in WW1   Kidney cancer Neg Hx    Kidney disease Neg Hx    Prostate cancer Neg Hx    Diabetes Neg Hx    Heart disease Neg Hx     Social History   Socioeconomic History   Marital status: Widowed    Spouse name: Not on file   Number of children: 2   Years of education: Not on file   Highest education level: Not on file  Occupational History   Occupation: Therapist, 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    Comment: [employer] then [employer] for [employer]  Tobacco Use   Smoking status: Former    Types: Cigarettes    Quit date: 10/10/1963    Years since quitting: 58.5   Smokeless tobacco: Never  Substance and Sexual Activity   Alcohol use: Yes    Comment: occasional   Drug use: Not on file   Sexual activity: Not on file  Other Topics Concern   Not on file  Social History Narrative   Wife died in 01-13-03   2 daughters  Has living will   Daughters would be health care POA   Requests DNR---done 10/10/18   No tube feeds   Social Determinants of Health   Financial Resource Strain: Not on file  Food Insecurity: Not on file  Transportation Needs: Not on file  Physical Activity: Not on file  Stress: Not on file  Social Connections: Not on file  Intimate Partner Violence: Not on file   Review of Systems Eating well Weight stable now Sleeps well--uses valerian, trazodone and melatonin Bowels okay with colace    Objective:   Physical Exam Constitutional:      Appearance: Normal appearance.  Cardiovascular:     Rate and Rhythm: Normal rate and regular rhythm.     Heart sounds:     No gallop.     Comments: Gr 3/6 aortic systolic murmur Pulmonary:     Effort: Pulmonary effort is normal.     Breath sounds: Normal breath sounds. No wheezing or rales.  Abdominal:     Palpations: Abdomen is soft.      Tenderness: There is no abdominal tenderness.  Musculoskeletal:     Cervical back: Neck supple.     Right lower leg: No edema.     Left lower leg: No edema.  Lymphadenopathy:     Cervical: No cervical adenopathy.  Neurological:     Mental Status: He is alert.  Psychiatric:        Mood and Affect: Mood normal.        Behavior: Behavior normal.            Assessment & Plan:

## 2022-04-20 NOTE — Assessment & Plan Note (Signed)
BP Readings from Last 3 Encounters:  04/20/22 136/76  03/16/22 138/76  02/03/22 123/67   Good control on HCTZ 12.'5mg'$ 

## 2022-04-20 NOTE — Assessment & Plan Note (Signed)
No symptoms May just be sclerosis----no action

## 2022-06-16 ENCOUNTER — Telehealth (SKILLED_NURSING_FACILITY): Payer: Medicare Other | Admitting: Student

## 2022-06-16 DIAGNOSIS — I10 Essential (primary) hypertension: Secondary | ICD-10-CM | POA: Diagnosis not present

## 2022-06-16 DIAGNOSIS — I350 Nonrheumatic aortic (valve) stenosis: Secondary | ICD-10-CM | POA: Diagnosis not present

## 2022-06-20 NOTE — Addendum Note (Signed)
Addended by: Dewayne Shorter on: 06/20/2022 10:15 AM   Modules accepted: Level of Service

## 2022-07-09 NOTE — Telephone Encounter (Addendum)
Called Patient's family to offer condolences with the loss of her father. Patient's TOD 8:51AM today. Will plan for transfer to [facility] today with future services held in [place]. Death Certificate Completed in [place] [facility].   I spent greater than 30 minutes coordinating care for the decedent's body, coordination with the family, nursing home, and documentation in the state system.   Coralyn Helling, MD, [facility] [phone]

## 2022-07-09 DEATH — deceased

## 2022-08-17 ENCOUNTER — Ambulatory Visit: Payer: Medicare Other | Admitting: Dermatology
